# Patient Record
Sex: Male | Born: 1990
Health system: Southern US, Community
[De-identification: ages and names within clinical notes are randomized; demographics above are authoritative.]

---
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
## 2015-01-16 ENCOUNTER — Emergency Department (HOSPITAL_COMMUNITY)
Admission: EM | Admit: 2015-01-16 | Discharge: 2015-01-16 | Disposition: A | Discharge status: Home / Self Care | Payer: 59 | Attending: Emergency Medicine | Admitting: Emergency Medicine

## 2015-01-16 ENCOUNTER — Encounter (HOSPITAL_COMMUNITY): Payer: Self-pay | Admitting: Nurse Practitioner

## 2015-01-16 DIAGNOSIS — Y998 Other external cause status: Secondary | ICD-10-CM | POA: Diagnosis not present

## 2015-01-16 DIAGNOSIS — S0081XA Abrasion of other part of head, initial encounter: Secondary | ICD-10-CM | POA: Insufficient documentation

## 2015-01-16 DIAGNOSIS — T07XXXA Unspecified multiple injuries, initial encounter: Secondary | ICD-10-CM

## 2015-01-16 DIAGNOSIS — S40812A Abrasion of left upper arm, initial encounter: Secondary | ICD-10-CM | POA: Insufficient documentation

## 2015-01-16 DIAGNOSIS — S060X0A Concussion without loss of consciousness, initial encounter: Secondary | ICD-10-CM | POA: Diagnosis not present

## 2015-01-16 DIAGNOSIS — S80212A Abrasion, left knee, initial encounter: Secondary | ICD-10-CM | POA: Insufficient documentation

## 2015-01-16 DIAGNOSIS — Y9389 Activity, other specified: Secondary | ICD-10-CM | POA: Diagnosis not present

## 2015-01-16 DIAGNOSIS — Z720 Tobacco use: Secondary | ICD-10-CM | POA: Insufficient documentation

## 2015-01-16 DIAGNOSIS — Y92410 Unspecified street and highway as the place of occurrence of the external cause: Secondary | ICD-10-CM | POA: Insufficient documentation

## 2015-01-16 MED ORDER — NAPROXEN 375 MG PO TABS: 375.0000 mg | ORAL_TABLET | Freq: Two times a day (BID) | ORAL | Status: DC

## 2015-01-16 MED ORDER — CYCLOBENZAPRINE HCL 10 MG PO TABS: 10.0000 mg | ORAL_TABLET | Freq: Three times a day (TID) | ORAL | Status: DC | PRN
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
## 2016-03-12 DIAGNOSIS — R112 Nausea with vomiting, unspecified: Secondary | ICD-10-CM | POA: Diagnosis not present

## 2016-06-14 DIAGNOSIS — S60512A Abrasion of left hand, initial encounter: Secondary | ICD-10-CM | POA: Diagnosis not present

## 2016-06-14 DIAGNOSIS — X79XXXA Intentional self-harm by blunt object, initial encounter: Secondary | ICD-10-CM | POA: Insufficient documentation

## 2016-06-14 DIAGNOSIS — Y939 Activity, unspecified: Secondary | ICD-10-CM | POA: Insufficient documentation

## 2016-06-14 DIAGNOSIS — F17210 Nicotine dependence, cigarettes, uncomplicated: Secondary | ICD-10-CM | POA: Insufficient documentation

## 2016-06-14 DIAGNOSIS — Z5181 Encounter for therapeutic drug level monitoring: Secondary | ICD-10-CM | POA: Diagnosis not present

## 2016-06-14 DIAGNOSIS — Y929 Unspecified place or not applicable: Secondary | ICD-10-CM | POA: Diagnosis not present

## 2016-06-14 DIAGNOSIS — F919 Conduct disorder, unspecified: Secondary | ICD-10-CM | POA: Diagnosis not present

## 2016-06-14 DIAGNOSIS — S6992XA Unspecified injury of left wrist, hand and finger(s), initial encounter: Secondary | ICD-10-CM | POA: Diagnosis present

## 2016-06-14 DIAGNOSIS — Y999 Unspecified external cause status: Secondary | ICD-10-CM | POA: Diagnosis not present

## 2016-06-15 ENCOUNTER — Encounter (HOSPITAL_COMMUNITY): Payer: Self-pay | Admitting: Emergency Medicine

## 2016-06-15 ENCOUNTER — Emergency Department (HOSPITAL_COMMUNITY)
Admission: EM | Admit: 2016-06-15 | Discharge: 2016-06-15 | Disposition: A | Discharge status: Home / Self Care | Payer: 59 | Attending: Emergency Medicine | Admitting: Emergency Medicine

## 2016-06-15 DIAGNOSIS — F1290 Cannabis use, unspecified, uncomplicated: Secondary | ICD-10-CM | POA: Diagnosis not present

## 2016-06-15 DIAGNOSIS — F17210 Nicotine dependence, cigarettes, uncomplicated: Secondary | ICD-10-CM

## 2016-06-15 DIAGNOSIS — Z5181 Encounter for therapeutic drug level monitoring: Secondary | ICD-10-CM | POA: Diagnosis not present

## 2016-06-15 DIAGNOSIS — F69 Unspecified disorder of adult personality and behavior: Secondary | ICD-10-CM | POA: Diagnosis not present

## 2016-06-15 DIAGNOSIS — Z79899 Other long term (current) drug therapy: Secondary | ICD-10-CM | POA: Diagnosis not present

## 2016-06-15 DIAGNOSIS — R4589 Other symptoms and signs involving emotional state: Secondary | ICD-10-CM

## 2016-06-15 DIAGNOSIS — R4689 Other symptoms and signs involving appearance and behavior: Secondary | ICD-10-CM | POA: Diagnosis present

## 2016-06-15 DIAGNOSIS — F919 Conduct disorder, unspecified: Secondary | ICD-10-CM | POA: Diagnosis not present

## 2016-06-15 DIAGNOSIS — S60512A Abrasion of left hand, initial encounter: Secondary | ICD-10-CM | POA: Diagnosis not present

## 2016-06-15 LAB — COMPREHENSIVE METABOLIC PANEL
ALT: 15 U/L — ABNORMAL LOW (ref 17–63)
AST: 29 U/L (ref 15–41)
Albumin: 4.8 g/dL (ref 3.5–5.0)
Alkaline Phosphatase: 46 U/L (ref 38–126)
Anion gap: 9 (ref 5–15)
BUN: 10 mg/dL (ref 6–20)
CO2: 26 mmol/L (ref 22–32)
Calcium: 9.2 mg/dL (ref 8.9–10.3)
Chloride: 105 mmol/L (ref 101–111)
Creatinine, Ser: 0.8 mg/dL (ref 0.61–1.24)
GFR calc Af Amer: >60 mL/min (ref >60)
GFR calc non Af Amer: >60 mL/min (ref >60)
Glucose, Bld: 92 mg/dL (ref 65–99)
Potassium: 3.4 mmol/L — ABNORMAL LOW (ref 3.5–5.1)
Sodium: 140 mmol/L (ref 135–145)
Total Bilirubin: 1 mg/dL (ref 0.3–1.2)
Total Protein: 7.3 g/dL (ref 6.5–8.1)

## 2016-06-15 LAB — CBC
HCT: 40.2 % (ref 39.0–52.0)
Hemoglobin: 14.2 g/dL (ref 13.0–17.0)
MCH: 31.4 pg (ref 26.0–34.0)
MCHC: 35.3 g/dL (ref 30.0–36.0)
MCV: 88.9 fL (ref 78.0–100.0)
Platelets: 255 10*3/uL (ref 150–400)
RBC: 4.52 MIL/uL (ref 4.22–5.81)
RDW: 11.9 % (ref 11.5–15.5)
WBC: 8.6 10*3/uL (ref 4.0–10.5)

## 2016-06-15 LAB — SALICYLATE LEVEL: Salicylate Lvl: <7 mg/dL (ref 2.8–30.0)

## 2016-06-15 LAB — ACETAMINOPHEN LEVEL: Acetaminophen (Tylenol), Serum: <10 ug/mL — ABNORMAL LOW (ref 10–30)

## 2016-06-15 LAB — RAPID URINE DRUG SCREEN, HOSP PERFORMED
Amphetamines: NOT DETECTED
Barbiturates: NOT DETECTED
Benzodiazepines: NOT DETECTED
Cocaine: NOT DETECTED
Opiates: NOT DETECTED
Tetrahydrocannabinol: POSITIVE — AB

## 2016-06-15 LAB — ETHANOL: Alcohol, Ethyl (B): <5 mg/dL (ref <5)

## 2016-06-15 MED ORDER — LORAZEPAM 1 MG PO TABS: 1.0000 mg | ORAL_TABLET | Freq: Three times a day (TID) | ORAL | Status: DC | PRN

## 2016-06-15 MED ORDER — ALUM & MAG HYDROXIDE-SIMETH 200-200-20 MG/5ML PO SUSP: 30.0000 mL | ORAL | Status: DC | PRN

## 2016-06-15 MED ORDER — ACETAMINOPHEN 325 MG PO TABS: 650.0000 mg | ORAL_TABLET | ORAL | Status: DC | PRN

## 2016-06-15 MED ORDER — ONDANSETRON HCL 4 MG PO TABS: 4.0000 mg | ORAL_TABLET | Freq: Three times a day (TID) | ORAL | Status: DC | PRN

## 2016-06-15 MED ORDER — NICOTINE 21 MG/24HR TD PT24: 21.0000 mg | MEDICATED_PATCH | Freq: Every day | TRANSDERMAL | Status: DC

## 2016-06-15 MED ORDER — IBUPROFEN 200 MG PO TABS: 600.0000 mg | ORAL_TABLET | Freq: Three times a day (TID) | ORAL | Status: DC | PRN

## 2016-06-15 MED ORDER — ZOLPIDEM TARTRATE 5 MG PO TABS: 5.0000 mg | ORAL_TABLET | Freq: Every evening | ORAL | Status: DC | PRN

## 2016-06-15 NOTE — ED Notes (Signed)
tts  In process
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
## 2016-06-15 NOTE — Discharge Instructions (Signed)
[facility] ([facility]) [address], [place], [place] [address] [phone] Call [facility]. Program includes intensive therapy in group setting and psychiatry services. (If, following your appointment, this program is not the best fit for you, you will be given information for individual therapy or other appropriate options a [facility])

## 2016-06-15 NOTE — ED Triage Notes (Signed)
Patient presents to ED with parents for assessment of anger episodes and SI.  Patient states "things piss me off all the time".  Patient agreed to come to ED if his parents would allow him to use the phone to call his girlfriend.  Patient broke his personal phone after calling his girlfriend over 10 times in a row.  He has also punched walls today and bashed his head on the wall "because it hurt".  Patient very apathetic to "are you having thoughts of hurting yourself" by stating "well, yeah, duh, all the time".  Patient denies plan.

## 2016-06-15 NOTE — ED Notes (Signed)
Pt and family was updated on disop decision; pt was apprehensive to staying; PA was advised; PA now at bedside speaking with family and pt

## 2016-06-15 NOTE — ED Notes (Signed)
Parents requested to be called before Psy eval this morning to be present at assessment;

## 2016-06-15 NOTE — Progress Notes (Signed)
Discussed pt's case with psychiatry team. Telepsych assessment this morning determined pt does not need inpatient treatment, however due to presence of severe depressive/anxious symptoms and recent relational stressors, it is recommended pt engage in outpatient psychiatric services. NP spoke with pt and pt's mother, recommending PHP due to pt's current symptoms. Mother and pt reportedly receptive. Understand traditional OP therapy/psychiatry is also an option.   Provided  information in pt's d/c summary for [facility] partial hospitalization program.   Ilean SkillMeghan Terrye Dombrosky, MSW, LCSW Clinical Social Work, Disposition  06/15/2016 [phone]

## 2016-06-15 NOTE — Progress Notes (Signed)
Per Donell SievertSpencer, Simon PA recommend a.m. Psych evaluation Kerstin Crusoe K. Sherlon HandingHarris, LCAS-A, LPC-A, [facility]  Counselor 06/15/2016 4:41 AM
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
## 2016-06-15 NOTE — ED Notes (Signed)
TTS being done at bedside 

## 2016-06-15 NOTE — BH Assessment (Signed)
Contacted pt. RN to order cart for TTS Marvene Strohm K. Sherlon HandingHarris, LCAS-A, LPC-A, [facility]  Counselor 06/15/2016 4:05 AM
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
## 2016-06-15 NOTE — ED Notes (Signed)
Patient was given a snack and drink, and a Regular Diet was ordered for Lunch. 

## 2016-06-23 ENCOUNTER — Ambulatory Visit (INDEPENDENT_AMBULATORY_CARE_PROVIDER_SITE_OTHER): Payer: 59 | Admitting: Licensed Clinical Social Worker

## 2016-06-23 DIAGNOSIS — F411 Generalized anxiety disorder: Secondary | ICD-10-CM | POA: Diagnosis not present
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
## 2016-07-06 ENCOUNTER — Ambulatory Visit (INDEPENDENT_AMBULATORY_CARE_PROVIDER_SITE_OTHER): Payer: 59 | Admitting: Licensed Clinical Social Worker

## 2016-07-06 DIAGNOSIS — F411 Generalized anxiety disorder: Secondary | ICD-10-CM | POA: Diagnosis not present

## 2016-07-07 ENCOUNTER — Encounter (HOSPITAL_COMMUNITY): Payer: Self-pay | Admitting: Licensed Clinical Social Worker
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
## 2016-07-13 ENCOUNTER — Ambulatory Visit (INDEPENDENT_AMBULATORY_CARE_PROVIDER_SITE_OTHER): Payer: 59 | Admitting: Licensed Clinical Social Worker

## 2016-07-13 ENCOUNTER — Encounter (HOSPITAL_COMMUNITY): Payer: Self-pay | Admitting: Licensed Clinical Social Worker

## 2016-07-13 DIAGNOSIS — F411 Generalized anxiety disorder: Secondary | ICD-10-CM

## 2016-07-13 NOTE — Progress Notes (Signed)
   THERAPIST PROGRESS NOTE  Session Time: 11:10-12pm  Participation Level: Active  Behavioral Response: DisheveledAlert/Quiet  Type of Therapy: Individual Therapy  Treatment Goals addressed: Coping  Interventions: CBT  Summary: Derek Lynn is a 26 y.o. male who presents with his homework assignments. Pt completed a paper on amotivational symdrome. Pt exhibits all the symptoms due to his marijuana use. Pt reports after completing the paper he is rethinking his marijuana use. Pt is also completing ADETS course for his pending DWI. Pt is concerned about the communication skills being used in his relationship. Went over I statements and fair fighting rules. Role played using I statements. Pt was active during the intervention.  Suicidal/Homicidal: Nowithout intent/plan  Therapist Response: Evaluated pt for progress. Assisted pt processing for stressors, communication skills, relationship problem solving, marijuana use.  Plan: Return again in 3 weeks. To monitor marijuana use. Suggested to pt to have his mother join for a session. He was in agreement and will discuss at next session.  Diagnosis: Axis I: GAD        Hailly Fess S, LCAS 07/13/2016

## 2016-07-28 ENCOUNTER — Ambulatory Visit (HOSPITAL_COMMUNITY): Payer: Self-pay | Admitting: Licensed Clinical Social Worker

## 2016-08-15 ENCOUNTER — Ambulatory Visit (INDEPENDENT_AMBULATORY_CARE_PROVIDER_SITE_OTHER): Payer: 59 | Admitting: Licensed Clinical Social Worker

## 2016-08-15 DIAGNOSIS — F411 Generalized anxiety disorder: Secondary | ICD-10-CM | POA: Diagnosis not present

## 2016-08-15 NOTE — Progress Notes (Signed)
   THERAPIST PROGRESS NOTE  Session Time: 11:10-12pm  Participation Level: Active  Behavioral Response: DisheveledAlert/Quiet  Type of Therapy: Individual Therapy  Treatment Goals addressed: Coping  Interventions: CBT  Summary: Derek Lynn is a 26 y.o. male. He brought his girlfriend in today. He wanted to talk about their relationship and communication skills. His girlfriend was quite nice and was able to discuss intelligently their relationship and communication style. She talked about the incident that brought pt to treatment. Pt also talked about how his frustration, feelings of anger became overwhelming thus causing a blowup. She has never seen him act that way and the pt is normally quiet and reserved. Discussed fair fighting rules and role played communication styles. Role played using I statements in communciation. They were both engaged and interested in both discussion and role playing. Discussed [name] continued use of marijuana. He admits to using almost everyday but the amount he uses varies. He has no desire to stop using it but is considering cutting down. Will continue to monitor this. Pt has also asked his mother to come with him to therapy.       Suicidal/Homicidal: Nowithout intent/plan  Therapist Response: Assessed [name] current functioning and reviewed progress.  Assisted pt processing , communication skills, relationship problem solving, marijuana use. Assisted pt processing for the management of his stressors.  Plan: Return again in 3 weeks. To monitor marijuana use. Suggested to pt to have his mother join for a session. He was in agreement .  Diagnosis: Axis I: GAD        Derek Lynn, LCAS 08/15/2016

## 2016-08-18 ENCOUNTER — Ambulatory Visit (HOSPITAL_COMMUNITY): Payer: Self-pay | Admitting: Psychiatry

## 2016-08-18 ENCOUNTER — Encounter (HOSPITAL_COMMUNITY): Payer: Self-pay | Admitting: Licensed Clinical Social Worker

## 2016-09-05 ENCOUNTER — Ambulatory Visit (HOSPITAL_COMMUNITY): Payer: Self-pay | Admitting: Licensed Clinical Social Worker

## 2017-05-16 ENCOUNTER — Encounter (HOSPITAL_COMMUNITY): Payer: Self-pay | Admitting: Licensed Clinical Social Worker

## 2017-05-16 ENCOUNTER — Ambulatory Visit (INDEPENDENT_AMBULATORY_CARE_PROVIDER_SITE_OTHER): Payer: 59 | Admitting: Licensed Clinical Social Worker

## 2017-05-16 DIAGNOSIS — F411 Generalized anxiety disorder: Secondary | ICD-10-CM
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
## 2017-06-08 ENCOUNTER — Encounter (HOSPITAL_COMMUNITY): Payer: Self-pay | Admitting: Licensed Clinical Social Worker

## 2017-06-08 ENCOUNTER — Ambulatory Visit (INDEPENDENT_AMBULATORY_CARE_PROVIDER_SITE_OTHER): Payer: 59 | Admitting: Licensed Clinical Social Worker

## 2017-06-08 DIAGNOSIS — F411 Generalized anxiety disorder: Secondary | ICD-10-CM

## 2017-06-08 NOTE — Progress Notes (Signed)
   THERAPIST PROGRESS NOTE  Session Time: 9:10-10am  Participation Level: Active  Behavioral Response: DisheveledAlert/Quiet  Type of Therapy: Individual Therapy  Treatment Goals addressed: Coping  Interventions: CBT  Summary: Derek Lynn is a 27 y.o. male who presents for therapy with his mother. Pt discussed his psychiatric symptoms and current life events.Pt was very distraught. His girlfriend has moved out and doesn't want to be in a relationship with pt anymore. Pt's mother reports pt hasn't slept in several nights, his anxiety has increased.Pt was unhealthily focused on the loss of the relationship. Any suggestion made during session pt was tearful and not willing to listen. Gave pt suggestions on working on himself so that he can become healthier and have healthier relationships in the future. Pt was unreasonable in his willingness to listen to any suggestions from therapist or his mother. Asked pt about potential self harm and he replied no. Gave mother info about what to do if that changes. Made appt with a psychiatrist, Dr. Vanetta ShawlHisada, for 3/16.    Suicidal/Homicidal: Nowithout intent/plan  Therapist Response: Assessed pt's current functioning and reviewed progress.  Assisted pt processing , current lfie events and psychiatric symptoms, psychiatrist, anxiety symptoms. Assisted pt processing for the management of his stressors.  Plan: Return again in 2 weeks   Diagnosis: Axis I: GAD        Kaitelyn Jamison S, LCAS 06/08/2017

## 2017-06-12 ENCOUNTER — Ambulatory Visit (HOSPITAL_COMMUNITY): Payer: Self-pay | Admitting: Licensed Clinical Social Worker

## 2017-06-14 ENCOUNTER — Ambulatory Visit (HOSPITAL_COMMUNITY): Payer: Self-pay | Admitting: Licensed Clinical Social Worker
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
## 2017-06-17 ENCOUNTER — Ambulatory Visit (INDEPENDENT_AMBULATORY_CARE_PROVIDER_SITE_OTHER): Payer: 59 | Admitting: Psychiatry

## 2017-06-17 ENCOUNTER — Encounter (HOSPITAL_COMMUNITY): Payer: Self-pay | Admitting: Psychiatry

## 2017-06-17 VITALS — BP 112/68 | HR 54 | Ht 70.0 in | Wt 169.0 lb

## 2017-06-17 DIAGNOSIS — F410 Panic disorder [episodic paroxysmal anxiety] without agoraphobia: Secondary | ICD-10-CM

## 2017-06-17 DIAGNOSIS — Z79899 Other long term (current) drug therapy: Secondary | ICD-10-CM

## 2017-06-17 DIAGNOSIS — R450 Nervousness: Secondary | ICD-10-CM

## 2017-06-17 DIAGNOSIS — Z630 Problems in relationship with spouse or partner: Secondary | ICD-10-CM | POA: Diagnosis not present

## 2017-06-17 DIAGNOSIS — G4700 Insomnia, unspecified: Secondary | ICD-10-CM | POA: Diagnosis not present

## 2017-06-17 DIAGNOSIS — F419 Anxiety disorder, unspecified: Secondary | ICD-10-CM

## 2017-06-17 DIAGNOSIS — Z87891 Personal history of nicotine dependence: Secondary | ICD-10-CM

## 2017-06-17 DIAGNOSIS — F331 Major depressive disorder, recurrent, moderate: Secondary | ICD-10-CM | POA: Diagnosis not present

## 2017-06-17 MED ORDER — SERTRALINE HCL 50 MG PO TABS: ORAL_TABLET | ORAL | 1 refills | Status: DC

## 2017-06-17 NOTE — Patient Instructions (Signed)
1. Start sertraline 25 mg at night for one week, then 50 mg at night 2. Continue to see a therapist 3. Obtain TSH 4. Return to clinic in one month

## 2017-06-18 LAB — TSH: TSH: 1.92 u[IU]/mL (ref 0.450–4.500)

## 2017-06-19 ENCOUNTER — Encounter (HOSPITAL_COMMUNITY): Payer: Self-pay | Admitting: Psychiatry

## 2017-06-30 MED FILL — SERTRALINE HCL 50 MG TABLET: 50 | 30 days supply | Qty: 30 | Fill #0

## 2017-07-05 ENCOUNTER — Encounter (HOSPITAL_COMMUNITY): Payer: Self-pay | Admitting: Licensed Clinical Social Worker

## 2017-07-05 ENCOUNTER — Ambulatory Visit (INDEPENDENT_AMBULATORY_CARE_PROVIDER_SITE_OTHER): Payer: 59 | Admitting: Licensed Clinical Social Worker

## 2017-07-05 DIAGNOSIS — F331 Major depressive disorder, recurrent, moderate: Secondary | ICD-10-CM

## 2017-07-05 NOTE — Progress Notes (Signed)
   THERAPIST PROGRESS NOTE  Session Time: 2:10-3pm  Participation Level: Active  Behavioral Response:Alert/Quiet  Type of Therapy: Individual Therapy  Treatment Goals addressed: Coping  Interventions: CBT  Summary: Derek Lynn is a 26 y.o. male who presents for therapy. Pt discussed his psychiatric symptoms and current life events. Pt [facility] with Dr. Hisadda [facility]. She prescribed Zoloft which pt just recently picked up from the pharmacy and has not taken any medication. Pt reports he researched the medication and does not think he needs medications and is concerned about the effect on his brain. I discussed the benefit of the medication with pt and asked open ended questions. The pt continues to ruminate about his girlfriend. Ask pt open ended questions. Pt is planning his future around a girl that has left him. Worked with pt on personal future plans. This was a difficult task for pt as he wants to involve all planning around his x girlfriend.   Suicidal/Homicidal: Nowithout intent/plan  Therapist Response: Assessed pt's current functioning and reviewed progress.  Assisted pt processing , current lfie events and psychiatric symptoms, psychiatrist, medication, future plans. Assisted pt processing for the management of his stressors.  Plan: Return again in 2 weeks   Diagnosis: Axis I: GAD        MACKENZIE,LISBETH S, LCAS 07/05/2017 

## 2017-07-12 ENCOUNTER — Ambulatory Visit (INDEPENDENT_AMBULATORY_CARE_PROVIDER_SITE_OTHER): Payer: 59 | Admitting: Licensed Clinical Social Worker

## 2017-07-12 ENCOUNTER — Encounter (HOSPITAL_COMMUNITY): Payer: Self-pay | Admitting: Licensed Clinical Social Worker

## 2017-07-12 DIAGNOSIS — F331 Major depressive disorder, recurrent, moderate: Secondary | ICD-10-CM

## 2017-07-12 NOTE — Progress Notes (Signed)
   THERAPIST PROGRESS NOTE  Session Time: 2:10-3pm  Participation Level: Active  Behavioral Response:Alert/Quiet/Euthymic  Type of Therapy: Individual Therapy  Treatment Goals addressed: Coping  Interventions: CBT  Summary: Derek Lynn is a 27 y.o. male who presents for therapy. Pt discussed his psychiatric symptoms and current life events. Pt reports he is less obsessive about his x girlfriend and their relationship, however, he still talks about a future with her. He still has not taken his prescribed medication. Discussed with pt what it would look like if he focused on himself. Asked open ended questions. Discussed with pt and showed him how to compartmentalize so that he will not become so overwhelmed with everything he is trying to accomplish. Pt is current focused on school, getting in, taking as many classes as possible so that he will graduate as soon as possible. Worked with pt on how to compartmentalize school with suggestions on first step.     Suicidal/Homicidal: Nowithout intent/plan  Therapist Response: Assessed pt's current functioning and reviewed progress.  Assisted pt processing , current lfie events and psychiatric symptoms, medication, future plans, compartmentalizing, school. Assisted pt processing for the management of his stressors.  Plan: Return again in 2 weeks   Diagnosis: Axis I: GAD        MACKENZIE,LISBETH S, LCAS 07/12/2017

## 2017-07-19 ENCOUNTER — Encounter (HOSPITAL_COMMUNITY): Payer: Self-pay | Admitting: Licensed Clinical Social Worker

## 2017-07-19 ENCOUNTER — Ambulatory Visit (INDEPENDENT_AMBULATORY_CARE_PROVIDER_SITE_OTHER): Payer: 59 | Admitting: Licensed Clinical Social Worker

## 2017-07-19 DIAGNOSIS — F331 Major depressive disorder, recurrent, moderate: Secondary | ICD-10-CM

## 2017-07-19 DIAGNOSIS — F411 Generalized anxiety disorder: Secondary | ICD-10-CM
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
## 2017-07-26 ENCOUNTER — Ambulatory Visit (HOSPITAL_COMMUNITY): Payer: Self-pay | Admitting: Licensed Clinical Social Worker

## 2017-07-27 NOTE — Progress Notes (Deleted)
BH MD/PA/NP OP Progress Note  07/27/2017 12:24 PM Derek Lynn  MRN:  [id number]  Chief Complaint:  HPI:   May have not started sertraline  Security, skateboarding THC positive,   Visit Diagnosis: No diagnosis found.  Past Psychiatric History:  I have reviewed the patient's psychiatry history in detail and updated the patient record. Outpatient: denies (diagnosed with ADD) Psychiatry admission: denies Previous suicide attempt: denies, denies SIB Past trials of medication: Adderall (discontinued in high school as he felt numb)  History of violence: denies    Past Medical History: No past medical history on file. No past surgical history on file.  Family Psychiatric History:  I have reviewed the patient's family history in detail and updated the patient record.   Family History: No family history on file.  Social History:  Social History   Socioeconomic History  . Marital status: Single    Spouse name: Not on file  . Number of children: 0  . Years of education: Not on file  . Highest education level: Some college, no degree  Occupational History  . Not on file  Social Needs  . Financial resource strain: Not hard at all  . Food insecurity:    Worry: Never true    Inability: Never true  . Transportation needs:    Medical: No    Non-medical: No  Tobacco Use  . Smoking status: Former Smoker    Types: Cigarettes  . Smokeless tobacco: Never Used  Substance and Sexual Activity  . Alcohol use: No    Frequency: Never  . Drug use: No    Types: Marijuana    Comment: last use over a month ago  . Sexual activity: Never  Lifestyle  . Physical activity:    Days per week: 7 days    Minutes per session: 50 min  . Stress: To some extent  Relationships  . Social connections:    Talks on phone: More than three times a week    Gets together: Never    Attends religious service: Never    Active member of club or organization: No    Attends meetings of clubs or  organizations: Never    Relationship status: Never married  Other Topics Concern  . Not on file  Social History Narrative  . Not on file    Allergies: No Known Allergies  Metabolic Disorder Labs: No results found for: HGBA1C, MPG No results found for: PROLACTIN No results found for: CHOL, TRIG, HDL, CHOLHDL, VLDL, LDLCALC Lab Results  Component Value Date   TSH 1.920 06/17/2017    Therapeutic Level Labs: No results found for: LITHIUM No results found for: VALPROATE No components found for:  CBMZ  Current Medications: Current Outpatient Medications  Medication Sig Dispense Refill  . sertraline (ZOLOFT) 50 MG tablet 25 mg at night for one week, then 50 mg at night 30 tablet 1   No current facility-administered medications for this visit.      Musculoskeletal: Strength & Muscle Tone: within normal limits Gait & Station: normal Patient leans: N/A  Psychiatric Specialty Exam: ROS  There were no vitals taken for this visit.There is no height or weight on file to calculate BMI.  General Appearance: Fairly Groomed  Eye Contact:  Good  Speech:  Clear and Coherent  Volume:  Normal  Mood:  {BHH MOOD:22306}  Affect:  {Affect (PAA):22687}  Thought Process:  Coherent  Orientation:  Full (Time, Place, and Person)  Thought Content: Logical   Suicidal Thoughts:  {  ST/HT (PAA):22692}  Homicidal Thoughts:  {ST/HT (PAA):22692}  Memory:  Immediate;   Good  Judgement:  {Judgement (PAA):22694}  Insight:  {Insight (PAA):22695}  Psychomotor Activity:  Normal  Concentration:  Concentration: Good and Attention Span: Good  Recall:  Good  Fund of Knowledge: Good  Language: Good  Akathisia:  No  Handed:  Right  AIMS (if indicated): not done  Assets:  Communication Skills Desire for Improvement  ADL's:  Intact  Cognition: WNL  Sleep:  {BHH GOOD/FAIR/POOR:22877}   Screenings:   Assessment and Plan:  Derek Lynn is a 27 y.o. year old male with a history of depression, anxiety,  marijuana use , who presents for follow up appointment for No diagnosis found.  # MDD, moderate, recurrent without psychotic features # Unspecified anxiety disorder # r/o marijuana induced mood disorder  Exam is notable for his flat affect and monotonous speech.  Patient endorses worsening neurovegetative symptoms with significant anxiety in the recent breakup with his girlfriend. Patient reports previous similar episode, which was also in the context of break up.  After having discussion in length, he agrees to start antidepressant. Will start sertraline to target neurovegetative and anxiety. Discussed potential GI side effect, drowsiness and increased SI in younger population. He is advised to contact the office if any side effect.  He will continue to see Ms. Mackenzie for therapy. No safety concern.   Plan 1. Start sertraline 25 mg at night for one week, then 50 mg at night 2. Continue to see a therapist  3. Reviewed TSH; wnl 4. Return to clinic in one month   The patient demonstrates the following risk factors for suicide: Chronic risk factors for suicide include: psychiatric disorder of depression, anxeity. Acute risk factors for suicide include: loss (financial, interpersonal, professional). Protective factors for this patient include: positive social support, responsibility to others (children, family), coping skills and hope for the future. Considering these factors, the overall suicide risk at this point appears to be low. Patient is appropriate for outpatient follow up.    Neysa Hotter, MD 07/27/2017, 12:24 PM

## 2017-07-29 ENCOUNTER — Ambulatory Visit (HOSPITAL_COMMUNITY): Payer: 59 | Admitting: Psychiatry

## 2017-08-22 ENCOUNTER — Ambulatory Visit (HOSPITAL_COMMUNITY): Payer: Self-pay | Admitting: Licensed Clinical Social Worker

## 2017-08-24 ENCOUNTER — Encounter (HOSPITAL_COMMUNITY): Payer: Self-pay | Admitting: Licensed Clinical Social Worker

## 2017-08-24 ENCOUNTER — Ambulatory Visit (INDEPENDENT_AMBULATORY_CARE_PROVIDER_SITE_OTHER): Payer: 59 | Admitting: Licensed Clinical Social Worker

## 2017-08-24 DIAGNOSIS — F331 Major depressive disorder, recurrent, moderate: Secondary | ICD-10-CM | POA: Diagnosis not present
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
## 2017-08-30 ENCOUNTER — Encounter (HOSPITAL_COMMUNITY): Payer: Self-pay | Admitting: Licensed Clinical Social Worker

## 2017-08-30 ENCOUNTER — Ambulatory Visit (INDEPENDENT_AMBULATORY_CARE_PROVIDER_SITE_OTHER): Payer: 59 | Admitting: Licensed Clinical Social Worker

## 2017-08-30 DIAGNOSIS — F331 Major depressive disorder, recurrent, moderate: Secondary | ICD-10-CM | POA: Diagnosis not present

## 2017-08-30 NOTE — Progress Notes (Signed)
   THERAPIST PROGRESS NOTE  Session Time: 2:10-3pm  Participation Level: Active  Behavioral Response:Alert/Quiet/Euthymic  Type of Therapy: Individual Therapy  Treatment Goals addressed: Coping  Interventions: CBT  Summary: Derek Lynn is a 27 y.o. male who presents for therapy. Pt discussed his psychiatric symptoms and current life events. Pt continues to report reports he is less obsessive about his x girlfriend and their relationship. Asked open ended questions.He still has not taken his prescribed medication.  Asked open ended questions about his continued refusal to take an antidepressant and the benefits of one. Pt continues to decide to work or go to school. Assisted pt making a list of benefits of both. Pt was able to articulate a plan to go to school, which begins tomorrow and work a part time job. Then pt proceeded to make a list of the pros and cons of each job he had applied for.    Suicidal/Homicidal: Nowithout intent/plan  Therapist Response: Assessed pt's current functioning and reviewed progress.  Assisted pt processing x girlfriend, benefits of medication, school vs work. Assisted pt processing for the management of his stressors.  Plan: Return again in 2 weeks   Diagnosis: Axis I: MDD        Vernona Rieger, LCAS 08/30/2017

## 2017-09-06 ENCOUNTER — Ambulatory Visit (HOSPITAL_COMMUNITY): Payer: Self-pay | Admitting: Licensed Clinical Social Worker

## 2017-09-13 ENCOUNTER — Encounter (HOSPITAL_COMMUNITY): Payer: Self-pay | Admitting: Licensed Clinical Social Worker

## 2017-09-13 ENCOUNTER — Encounter

## 2017-09-13 ENCOUNTER — Ambulatory Visit (INDEPENDENT_AMBULATORY_CARE_PROVIDER_SITE_OTHER): Payer: 59 | Admitting: Licensed Clinical Social Worker

## 2017-09-13 DIAGNOSIS — F331 Major depressive disorder, recurrent, moderate: Secondary | ICD-10-CM

## 2017-09-13 NOTE — Progress Notes (Signed)
   THERAPIST PROGRESS NOTE  Session Time: 2:10-3pm  Participation Level: Active  Behavioral Response:Alert/Quiet/Euthymic  Type of Therapy: Individual Therapy  Treatment Goals addressed: Coping  Interventions: CBT  Summary: Derek Lynn is a 27 y.o. male who presents for therapy. Pt discussed his psychiatric symptoms and current life events. Pt reports he has been more anxious and have more depressive symptoms. Re-discussed the benefits of medication. Discussed how psychiatric medications work on the brain's receptor sites. Also talked about the DNA test that will assist with what meds would work well with his DNA. Pt is still resistant to meds. He continues to talk on the phone with his x girlfriend but is much less obsessive. Asked open ended questions about continuing to talk on the phone with X. Discussed with pt the benefit of not entering into another relationship but continue working on self. Pt is lonely and is alone quite a bit. Gave pt suggestions on how to meet new people. Pt continues his part time job and is in school at [employer].    Suicidal/Homicidal: Nowithout intent/plan  Therapist Response: Assessed pt's current functioning and reviewed progress.  Assisted pt processing x girlfriend, benefits of medication, ways to meet new people, self care.  Assisted pt processing for the management of his stressors.  Plan: Return again in 2 weeks   Diagnosis: Axis I: MDD        Vernona RiegerMACKENZIE,LISBETH S, LCAS 09/13/2017

## 2017-09-20 ENCOUNTER — Ambulatory Visit (INDEPENDENT_AMBULATORY_CARE_PROVIDER_SITE_OTHER): Payer: 59 | Admitting: Licensed Clinical Social Worker

## 2017-09-20 ENCOUNTER — Encounter (HOSPITAL_COMMUNITY): Payer: Self-pay | Admitting: Licensed Clinical Social Worker

## 2017-09-20 DIAGNOSIS — F331 Major depressive disorder, recurrent, moderate: Secondary | ICD-10-CM | POA: Diagnosis not present

## 2017-09-20 NOTE — Progress Notes (Signed)
   THERAPIST PROGRESS NOTE  Session Time: 3:10-4pm  Participation Level: Active  Behavioral Response:Alert/Quiet/Sad/Depressed  Type of Therapy: Individual Therapy  Treatment Goals addressed: Coping  Interventions: CBT  Summary: Derek Lynn is a 27 y.o. male who presents for therapy. Pt discussed his psychiatric symptoms and current life events. Pt presented looking very sad and depressed. He ruminates on the same thing: x girlfriend who is coming to [place] this week to get all her things and go back to [place]. She has asked to see him while she is here. Discussed this at length with ZO:XWRUEpt:risks and benefits of seeing her. Pt reports he still is in school and is doing well. He has a new job at the [employer] [place] but was pessimistic about that. There was nothing positive discussed during session.    Suicidal/Homicidal: Nowithout intent/plan  Therapist Response: Assessed pt's current functioning and reviewed progress.  Assisted pt processing x girlfriend, risks and benefits of see x girlfriend. self care.  Assisted pt processing for the management of his stressors.  Plan: Return again in 2 weeks   Diagnosis: Axis I: MDD        Derek Lynn,LISBETH S, LCAS 09/20/2017

## 2017-09-27 ENCOUNTER — Ambulatory Visit (HOSPITAL_COMMUNITY): Payer: Self-pay | Admitting: Licensed Clinical Social Worker

## 2017-09-28 ENCOUNTER — Ambulatory Visit (HOSPITAL_COMMUNITY): Payer: Self-pay | Admitting: Licensed Clinical Social Worker

## 2017-10-11 ENCOUNTER — Encounter (HOSPITAL_COMMUNITY): Payer: Self-pay | Admitting: Licensed Clinical Social Worker

## 2017-10-11 ENCOUNTER — Ambulatory Visit (INDEPENDENT_AMBULATORY_CARE_PROVIDER_SITE_OTHER): Payer: 59 | Admitting: Licensed Clinical Social Worker

## 2017-10-11 DIAGNOSIS — F331 Major depressive disorder, recurrent, moderate: Secondary | ICD-10-CM | POA: Diagnosis not present

## 2017-10-11 NOTE — Progress Notes (Signed)
   THERAPIST PROGRESS NOTE  Session Time: 3:10-4pm  Participation Level: Active  Behavioral Response:Alert/Quiet/Sad/Depressed  Type of Therapy: Individual Therapy  Treatment Goals addressed: Coping  Interventions: CBT  Summary: Derek Lynn is a 27 y.o. male who presents for therapy. Pt discussed his psychiatric symptoms and current life events. Pt presents sad and depressed, with his head hanging down. He ruminated about his girlfriend coming to town, packing up all her belongings and [place]. Asked open ended questions and validated his feelings. Discussed external factors that affect his mood. Pt started a job with [employer] in [place]. He was affect was flat in discussing his new job. Discussed with pt how the new job fits in his goal planning. Discussed with pt gratitudes and how many positive things he has in his life. He was able to name numerous gratitudes and said he felt a little better. Discussed with pt coping tools to deal with his feelings of depression. He still maintains he will not take medication.   Suicidal/Homicidal: Nowithout intent/plan  Therapist Response: Assessed pt's current functioning and reviewed progress.  Assisted pt processing x girlfriend, new job, goal planning, gratitudes, medication.  Assisted pt processing for the management of his stressors.  Plan: Return again in 2 weeks   Diagnosis: Axis I: MDD        [name] S, LCAS 10/11/2017

## 2017-10-26 ENCOUNTER — Ambulatory Visit (HOSPITAL_COMMUNITY): Payer: Self-pay | Admitting: Licensed Clinical Social Worker

## 2017-11-07 ENCOUNTER — Ambulatory Visit (HOSPITAL_COMMUNITY): Payer: Self-pay | Admitting: Licensed Clinical Social Worker

## 2017-11-14 ENCOUNTER — Ambulatory Visit (HOSPITAL_COMMUNITY): Payer: Self-pay | Admitting: Licensed Clinical Social Worker

## 2017-11-21 ENCOUNTER — Ambulatory Visit (INDEPENDENT_AMBULATORY_CARE_PROVIDER_SITE_OTHER): Payer: 59 | Admitting: Licensed Clinical Social Worker

## 2017-11-21 ENCOUNTER — Encounter (HOSPITAL_COMMUNITY): Payer: Self-pay | Admitting: Licensed Clinical Social Worker

## 2017-11-21 ENCOUNTER — Ambulatory Visit (HOSPITAL_COMMUNITY): Payer: Self-pay | Admitting: Licensed Clinical Social Worker

## 2017-11-21 DIAGNOSIS — F331 Major depressive disorder, recurrent, moderate: Secondary | ICD-10-CM

## 2017-11-21 NOTE — Progress Notes (Signed)
   THERAPIST PROGRESS NOTE  Session Time: 3:10-4pm  Participation Level: Active  Behavioral Response:Alert/Quiet/Sad/Depressed  Type of Therapy: Individual Therapy  Treatment Goals addressed: Coping  Interventions: CBT  Summary: Derek Lynn is a 27 y.o. male who presents for therapy. Pt discussed his psychiatric symptoms and current life events. Pt presents sad and depressed, with his head hanging down. Pt was being discharged for failing to come in for appointments. The letter had not been sent out yet. I talked with the pt about his continuing therapy. He is depressed and has anxiety but he is not willing to do anything about it. I suggested PHP to him. He asked questions about the program. He is still unwilling to take medication. I am unsure how to continue to work with the pt who does not try to do anything different. He has quit both jobs he had because he didn't like them. He is in school still but has to take his course over because he didn't take the final exam. I asked open ended questions about continuation of therapy. I suggested he think about PHP and his desire to continue in therapy and to let me know.  Suicidal/Homicidal: Nowithout intent/plan  Therapist Response: Assessed [name] current functioning and reviewed progress.  Assisted pt processing continuation of therapy, PHP, what he'Lynn willing to do different, medication.  Assisted pt processing for the management of his stressors.  Plan: Return again in 2 weeks   Diagnosis: Axis I: MDD        Derek Lynn, LCAS 11/21/2017

## 2017-11-28 ENCOUNTER — Ambulatory Visit (HOSPITAL_COMMUNITY): Payer: Self-pay | Admitting: Licensed Clinical Social Worker

## 2017-12-07 ENCOUNTER — Ambulatory Visit (HOSPITAL_COMMUNITY): Payer: Self-pay | Admitting: Licensed Clinical Social Worker

## 2017-12-14 ENCOUNTER — Ambulatory Visit (HOSPITAL_COMMUNITY): Payer: Self-pay | Admitting: Licensed Clinical Social Worker

## 2017-12-20 ENCOUNTER — Ambulatory Visit (HOSPITAL_COMMUNITY): Payer: Self-pay | Admitting: Licensed Clinical Social Worker

## 2017-12-21 ENCOUNTER — Ambulatory Visit (HOSPITAL_COMMUNITY): Payer: Self-pay | Admitting: Licensed Clinical Social Worker

## 2017-12-27 ENCOUNTER — Encounter (HOSPITAL_COMMUNITY): Payer: Self-pay | Admitting: Licensed Clinical Social Worker

## 2017-12-27 ENCOUNTER — Ambulatory Visit (HOSPITAL_COMMUNITY): Payer: Self-pay | Admitting: Licensed Clinical Social Worker

## 2017-12-28 ENCOUNTER — Ambulatory Visit (HOSPITAL_COMMUNITY): Payer: Self-pay | Admitting: Licensed Clinical Social Worker

## 2018-02-15 ENCOUNTER — Encounter (HOSPITAL_COMMUNITY): Payer: Self-pay | Admitting: Emergency Medicine

## 2018-02-15 ENCOUNTER — Emergency Department (HOSPITAL_COMMUNITY)
Admission: EM | Admit: 2018-02-15 | Discharge: 2018-02-15 | Disposition: A | Discharge status: Home / Self Care | Payer: 59 | Attending: Emergency Medicine | Admitting: Emergency Medicine

## 2018-02-15 DIAGNOSIS — R112 Nausea with vomiting, unspecified: Secondary | ICD-10-CM | POA: Diagnosis not present

## 2018-02-15 DIAGNOSIS — R197 Diarrhea, unspecified: Secondary | ICD-10-CM | POA: Insufficient documentation

## 2018-02-15 DIAGNOSIS — R1013 Epigastric pain: Secondary | ICD-10-CM | POA: Diagnosis not present

## 2018-02-15 DIAGNOSIS — Z87891 Personal history of nicotine dependence: Secondary | ICD-10-CM | POA: Diagnosis not present

## 2018-02-15 LAB — URINALYSIS, ROUTINE W REFLEX MICROSCOPIC
Bilirubin Urine: NEGATIVE
Glucose, UA: NEGATIVE mg/dL
HGB URINE DIPSTICK: NEGATIVE
KETONES UR: NEGATIVE mg/dL
Leukocytes, UA: NEGATIVE
Nitrite: NEGATIVE
Protein, ur: NEGATIVE mg/dL
SPECIFIC GRAVITY, URINE: 1.027 (ref 1.005–1.030)
pH: 7 (ref 5.0–8.0)

## 2018-02-15 LAB — CBC
HEMATOCRIT: 46.4 % (ref 39.0–52.0)
Hemoglobin: 15.5 g/dL (ref 13.0–17.0)
MCH: 31.4 pg (ref 26.0–34.0)
MCHC: 33.4 g/dL (ref 30.0–36.0)
MCV: 94.1 fL (ref 80.0–100.0)
Platelets: 272 10*3/uL (ref 150–400)
RBC: 4.93 MIL/uL (ref 4.22–5.81)
RDW: 12.4 % (ref 11.5–15.5)
WBC: 16.1 10*3/uL — ABNORMAL HIGH (ref 4.0–10.5)
nRBC: 0 % (ref 0.0–0.2)

## 2018-02-15 LAB — COMPREHENSIVE METABOLIC PANEL
ALK PHOS: 42 U/L (ref 38–126)
ALT: 18 U/L (ref 0–44)
AST: 28 U/L (ref 15–41)
Albumin: 5.1 g/dL — ABNORMAL HIGH (ref 3.5–5.0)
Anion gap: 12 (ref 5–15)
BUN: 18 mg/dL (ref 6–20)
CHLORIDE: 110 mmol/L (ref 98–111)
CO2: 23 mmol/L (ref 22–32)
CREATININE: 0.82 mg/dL (ref 0.61–1.24)
Calcium: 9.7 mg/dL (ref 8.9–10.3)
GFR calc Af Amer: >60 mL/min (ref >60)
Glucose, Bld: 148 mg/dL — ABNORMAL HIGH (ref 70–99)
Potassium: 3.7 mmol/L (ref 3.5–5.1)
SODIUM: 145 mmol/L (ref 135–145)
Total Bilirubin: 1 mg/dL (ref 0.3–1.2)
Total Protein: 8.1 g/dL (ref 6.5–8.1)

## 2018-02-15 LAB — LIPASE, BLOOD: LIPASE: 28 U/L (ref 11–51)

## 2018-02-15 MED ORDER — FAMOTIDINE 20 MG PO TABS
40.0000 mg | ORAL_TABLET | Freq: Once | ORAL | Status: AC
  Administered 2018-02-15: 40 mg via ORAL
  Filled 2018-02-15: qty 2

## 2018-02-15 MED ORDER — SODIUM CHLORIDE 0.9 % IV BOLUS
1000.0000 mL | Freq: Once | INTRAVENOUS | Status: AC
  Administered 2018-02-15: 1000 mL via INTRAVENOUS

## 2018-02-15 MED ORDER — ONDANSETRON HCL 4 MG/2ML IJ SOLN
4.0000 mg | Freq: Once | INTRAMUSCULAR | Status: AC
  Administered 2018-02-15: 4 mg via INTRAVENOUS
  Filled 2018-02-15: qty 2

## 2018-02-15 MED ORDER — ONDANSETRON 4 MG PO TBDP: 4.0000 mg | ORAL_TABLET | Freq: Three times a day (TID) | ORAL | 0 refills | Status: DC | PRN

## 2018-02-15 MED ORDER — FAMOTIDINE 20 MG PO TABS: 20.0000 mg | ORAL_TABLET | Freq: Two times a day (BID) | ORAL | 0 refills | Status: AC

## 2018-02-15 MED ORDER — FAMOTIDINE IN NACL 20-0.9 MG/50ML-% IV SOLN
20.0000 mg | Freq: Once | INTRAVENOUS | Status: AC
  Administered 2018-02-15: 20 mg via INTRAVENOUS
  Filled 2018-02-15: qty 50

## 2018-02-15 MED FILL — ONDANSETRON ODT 4 MG TABLET: 4 | 3 days supply | Qty: 10 | Fill #0

## 2018-02-15 MED FILL — FAMOTIDINE 20 MG TABLET: 20 | 5 days supply | Qty: 10 | Fill #0

## 2018-02-15 NOTE — ED Notes (Addendum)
Water and soda given at this time

## 2018-02-15 NOTE — ED Provider Notes (Signed)
[facility]-EMERGENCY DEPT Provider Note   CSN: [id number] Arrival date & time: 02/15/18  [id number]     History   Chief Complaint Chief Complaint  Patient presents with  . Abdominal Pain  . Diarrhea  . Vomiting    HPI Derek Lynn is a 27 y.o. male.  Patient presents to the emergency department today with acute onset of nausea, vomiting, and diarrhea starting at 2 AM.  Patient has no past surgical history.  He reports eating at a restaurant last night.  He denies any other sick contacts.  Pain is in the epigastrium and does not radiate.  He has had associated nonbilious vomiting that is persistent.  He has had watery stool, repeatedly, that does not contain any blood.  No urinary symptoms.  No treatments prior to arrival.  He denies heavy NSAID or alcohol use.     History reviewed. No pertinent past medical history.  Patient Active Problem List   Diagnosis Date Noted  . MDD (major depressive disorder), recurrent episode, moderate (HCC) 06/17/2017  . GAD (generalized anxiety disorder) 08/15/2016  . Aggression   . Behavior concern in adult     History reviewed. No pertinent surgical history.      Home Medications    Prior to Admission medications   Medication Sig Start Date End Date Taking? Authorizing Provider  sertraline (ZOLOFT) 50 MG tablet 25 mg at night for one week, then 50 mg at night 06/17/17   Neysa HotterHisada, Reina, MD    Family History No family history on file.  Social History Social History   Tobacco Use  . Smoking status: Former Smoker    Types: Cigarettes  . Smokeless tobacco: Never Used  Substance Use Topics  . Alcohol use: No    Frequency: Never  . Drug use: No    Types: Marijuana    Comment: last use over a month ago     Allergies   Patient has no known allergies.   Review of Systems Review of Systems  Constitutional: Negative for fever.  HENT: Negative for rhinorrhea and sore throat.   Eyes: Negative for redness.    Respiratory: Negative for cough.   Cardiovascular: Negative for chest pain.  Gastrointestinal: Positive for abdominal pain, diarrhea, nausea and vomiting.  Genitourinary: Negative for dysuria.  Musculoskeletal: Negative for myalgias.  Skin: Negative for rash.  Neurological: Negative for headaches.     Physical Exam Updated Vital Signs BP 138/87 (BP Location: Left Arm)   Pulse 67   Temp 98 F (36.7 C) (Oral)   Resp 18   SpO2 99%   Physical Exam  Constitutional: He appears well-developed and well-nourished.  HENT:  Head: Normocephalic and atraumatic.  Eyes: Conjunctivae are normal. Right eye exhibits no discharge. Left eye exhibits no discharge.  Neck: Normal range of motion. Neck supple.  Cardiovascular: Normal rate, regular rhythm and normal heart sounds.  Pulmonary/Chest: Effort normal and breath sounds normal.  Abdominal: Soft. There is tenderness in the epigastric area. There is no rebound, no guarding, no tenderness at McBurney's point and negative Murphy's sign.  Neurological: He is alert.  Skin: Skin is warm and dry.  Psychiatric: He has a normal mood and affect.  Nursing note and vitals reviewed.    ED Treatments / Results  Labs (all labs ordered are listed, but only abnormal results are displayed) Labs Reviewed  LIPASE, BLOOD  COMPREHENSIVE METABOLIC PANEL  CBC  URINALYSIS, ROUTINE W REFLEX MICROSCOPIC    EKG None  Radiology  No results found.  Procedures Procedures (including critical care time)  Medications Ordered in ED Medications  ondansetron (ZOFRAN) injection 4 mg (has no administration in time range)  famotidine (PEPCID) IVPB 20 mg premix (has no administration in time range)  sodium chloride 0.9 % bolus 1,000 mL (0 mLs Intravenous Stopped 02/15/18 0933)  ondansetron (ZOFRAN) injection 4 mg (4 mg Intravenous Given 02/15/18 0829)  famotidine (PEPCID) tablet 40 mg (40 mg Oral Given 02/15/18 1034)     Initial Impression / Assessment and  Plan / ED Course  I have reviewed the triage vital signs and the nursing notes.  Pertinent labs & imaging results that were available during my care of the patient were reviewed by me and considered in my medical decision making (see chart for details).     Patient seen and examined. Work-up initiated. Medications ordered.  Patient with acute onset of nausea, vomiting, and diarrhea.  Abdominal exam is reassuring.  Labs pending.  Bolus and Zofran ordered.  Vital signs reviewed and are as follows: BP 138/87 (BP Location: Left Arm)   Pulse 67   Temp 98 F (36.7 C) (Oral)   Resp 18   SpO2 99%   11:09 AM on earlier recheck, patient was feeling better.  Minimal epigastric pain.  Improved nausea, vomiting has stopped.  Patient was given Pepcid.  After this, he vomited.  He has had a little bit more to drink.  Will give additional antiemetics and reassess.  On abdominal exam, abdomen is soft and minimally tender in the epigastrium.  No peritoneal signs.  No lower abdominal tenderness or right upper quadrant tenderness.  12:43 PM patient doing better.  Ready for discharge.  We discussed clear liquid diet for the next 24 hours and brat diet and slow advancement after that.  The patient was urged to return to the Emergency Department immediately with worsening of current symptoms, worsening abdominal pain, persistent vomiting, blood noted in stools, fever, or any other concerns.  Also discussed return for recheck if symptoms are not improved or worsen in the next 24 hours.  The patient verbalized understanding.    Final Clinical Impressions(s) / ED Diagnoses   Final diagnoses:  Nausea vomiting and diarrhea   Patient with acute onset of N/V/D symptoms consistent at this time with viral gastroenteritis. Vitals are stable, no fever.  Patient has a leukocytosis however this is not unexpected with his active symptoms.  No signs of dehydration, tolerating PO's. Lungs are clear. No focal abdominal pain.  Low concern for appendicitis, cholecystitis, pancreatitis, ruptured viscus, UTI, kidney stone, aortic dissection, aortic aneurysm or other emergent abdominal etiology. Supportive therapy indicated with return if symptoms worsen. Patient counseled.   ED Discharge Orders         Ordered    ondansetron (ZOFRAN ODT) 4 MG disintegrating tablet  Every 8 hours PRN     02/15/18 1103    famotidine (PEPCID) 20 MG tablet  2 times daily     02/15/18 1103           Renne Crigler, PA-C 02/15/18 1244    Tegeler, Canary Brim, MD 02/15/18 [phone]

## 2018-02-15 NOTE — ED Triage Notes (Signed)
Pt c/o bad pains with n/v/d since around 2 am.

## 2018-02-15 NOTE — ED Notes (Signed)
PT made aware urine sample still pending. Urinal in hand

## 2018-02-15 NOTE — ED Notes (Signed)
Pt made aware of urine sample order.unable to provide at this time

## 2018-02-15 NOTE — Discharge Instructions (Signed)
Please read and follow all provided instructions.  Your diagnoses today include:  1. Nausea vomiting and diarrhea     Tests performed today include:  Blood counts and electrolytes - high white blood cells which is expected  Blood tests to check liver and kidney function  Blood tests to check pancreas function  Urine test to look for infection  Vital signs. See below for your results today.   Medications prescribed:   Zofran (ondansetron) - for nausea and vomiting   Pepcid (famotidine) - antihistamine  You can find this medication over-the-counter.   DO NOT exceed:   20mg  Pepcid every 12 hours  Take any prescribed medications only as directed.  Home care instructions:   Follow any educational materials contained in this packet.   Your abdominal pain, nausea, vomiting, and diarrhea may be caused by a viral gastroenteritis also called 'stomach flu'. You should rest for the next several days. Keep drinking plenty of fluids and use the medicine for nausea as directed.    Drink clear liquids for the next 24 hours and introduce solid foods slowly after 24 hours using the b.r.a.t. diet (Bananas, Rice, Applesauce, Toast, Yogurt).    Follow-up instructions: Please follow-up with your primary care provider in the next 2 days for further evaluation of your symptoms. If you are not feeling better in 48 hours you may have a condition that is more serious and you need re-evaluation.   Return instructions:  SEEK IMMEDIATE MEDICAL ATTENTION IF:  If you have pain that does not go away or becomes severe   A temperature above 101F develops   Repeated vomiting occurs (multiple episodes)   If you have pain that becomes localized to portions of the abdomen. The right side could possibly be appendicitis. In an adult, the left lower portion of the abdomen could be colitis or diverticulitis.   Blood is being passed in stools or vomit (bright red or black tarry stools)   You develop  chest pain, difficulty breathing, dizziness or fainting, or become confused, poorly responsive, or inconsolable (young children)  If you have any other emergent concerns regarding your health  Additional Information: Abdominal (belly) pain can be caused by many things. Your caregiver performed an examination and possibly ordered blood/urine tests and imaging (CT scan, x-rays, ultrasound). Many cases can be observed and treated at home after initial evaluation in the emergency department. Even though you are being discharged home, abdominal pain can be unpredictable. Therefore, you need a repeated exam if your pain does not resolve, returns, or worsens. Most patients with abdominal pain don't have to be admitted to the hospital or have surgery, but serious problems like appendicitis and gallbladder attacks can start out as nonspecific pain. Many abdominal conditions cannot be diagnosed in one visit, so follow-up evaluations are very important.  Your vital signs today were: BP 125/65    Pulse 60    Temp 98 F (36.7 C) (Oral)    Resp 18    SpO2 100%  If your blood pressure (bp) was elevated above 135/85 this visit, please have this repeated by your doctor within one month. --------------

## 2019-02-16 DIAGNOSIS — L309 Dermatitis, unspecified: Secondary | ICD-10-CM | POA: Diagnosis not present

## 2019-02-16 MED FILL — predniSONE 10 MG TABS: 10 | 6 days supply | Qty: 21 | Fill #0

## 2021-07-04 ENCOUNTER — Emergency Department (HOSPITAL_COMMUNITY): Payer: Self-pay

## 2021-07-04 ENCOUNTER — Emergency Department (HOSPITAL_COMMUNITY)
Admission: EM | Admit: 2021-07-04 | Discharge: 2021-07-04 | Disposition: A | Discharge status: Home / Self Care | Payer: Self-pay | Attending: Emergency Medicine | Admitting: Emergency Medicine

## 2021-07-04 ENCOUNTER — Encounter (HOSPITAL_COMMUNITY): Payer: Self-pay

## 2021-07-04 DIAGNOSIS — R1033 Periumbilical pain: Secondary | ICD-10-CM

## 2021-07-04 DIAGNOSIS — R7989 Other specified abnormal findings of blood chemistry: Secondary | ICD-10-CM | POA: Insufficient documentation

## 2021-07-04 DIAGNOSIS — D72829 Elevated white blood cell count, unspecified: Secondary | ICD-10-CM | POA: Insufficient documentation

## 2021-07-04 DIAGNOSIS — K6389 Other specified diseases of intestine: Secondary | ICD-10-CM | POA: Diagnosis not present

## 2021-07-04 DIAGNOSIS — R1013 Epigastric pain: Secondary | ICD-10-CM | POA: Diagnosis not present

## 2021-07-04 DIAGNOSIS — R109 Unspecified abdominal pain: Secondary | ICD-10-CM | POA: Diagnosis not present

## 2021-07-04 DIAGNOSIS — R1084 Generalized abdominal pain: Secondary | ICD-10-CM | POA: Insufficient documentation

## 2021-07-04 DIAGNOSIS — R945 Abnormal results of liver function studies: Secondary | ICD-10-CM | POA: Diagnosis not present

## 2021-07-04 LAB — URINALYSIS, ROUTINE W REFLEX MICROSCOPIC
Bilirubin Urine: NEGATIVE
Glucose, UA: NEGATIVE mg/dL
Hgb urine dipstick: NEGATIVE
Ketones, ur: NEGATIVE mg/dL
Leukocytes,Ua: NEGATIVE
Nitrite: NEGATIVE
Protein, ur: NEGATIVE mg/dL
Specific Gravity, Urine: 1.04 — ABNORMAL HIGH (ref 1.005–1.030)
pH: 7 (ref 5.0–8.0)

## 2021-07-04 LAB — CBC WITH DIFFERENTIAL/PLATELET
Abs Immature Granulocytes: 0.05 10*3/uL (ref 0.00–0.07)
Basophils Absolute: 0 10*3/uL (ref 0.0–0.1)
Basophils Relative: 0 %
Eosinophils Absolute: 0.3 10*3/uL (ref 0.0–0.5)
Eosinophils Relative: 2 %
HCT: 42.7 % (ref 39.0–52.0)
Hemoglobin: 15.1 g/dL (ref 13.0–17.0)
Immature Granulocytes: 0 %
Lymphocytes Relative: 10 %
Lymphs Abs: 1.2 10*3/uL (ref 0.7–4.0)
MCH: 31.8 pg (ref 26.0–34.0)
MCHC: 35.4 g/dL (ref 30.0–36.0)
MCV: 89.9 fL (ref 80.0–100.0)
Monocytes Absolute: 0.7 10*3/uL (ref 0.1–1.0)
Monocytes Relative: 6 %
Neutro Abs: 10.2 10*3/uL — ABNORMAL HIGH (ref 1.7–7.7)
Neutrophils Relative %: 82 %
Platelets: 199 10*3/uL (ref 150–400)
RBC: 4.75 MIL/uL (ref 4.22–5.81)
RDW: 11.7 % (ref 11.5–15.5)
WBC: 12.4 10*3/uL — ABNORMAL HIGH (ref 4.0–10.5)
nRBC: 0 % (ref 0.0–0.2)

## 2021-07-04 LAB — COMPREHENSIVE METABOLIC PANEL
ALT: 48 U/L — ABNORMAL HIGH (ref 0–44)
AST: 83 U/L — ABNORMAL HIGH (ref 15–41)
Albumin: 4.7 g/dL (ref 3.5–5.0)
Alkaline Phosphatase: 38 U/L (ref 38–126)
Anion gap: 8 (ref 5–15)
BUN: 15 mg/dL (ref 6–20)
CO2: 29 mmol/L (ref 22–32)
Calcium: 9.2 mg/dL (ref 8.9–10.3)
Chloride: 102 mmol/L (ref 98–111)
Creatinine, Ser: 0.81 mg/dL (ref 0.61–1.24)
GFR, Estimated: >60 mL/min (ref >60)
Glucose, Bld: 143 mg/dL — ABNORMAL HIGH (ref 70–99)
Potassium: 3.3 mmol/L — ABNORMAL LOW (ref 3.5–5.1)
Sodium: 139 mmol/L (ref 135–145)
Total Bilirubin: 0.5 mg/dL (ref 0.3–1.2)
Total Protein: 7.6 g/dL (ref 6.5–8.1)

## 2021-07-04 LAB — LIPASE, BLOOD: Lipase: 37 U/L (ref 11–51)

## 2021-07-04 MED ORDER — HYDROCODONE-ACETAMINOPHEN 5-325 MG PO TABS: 1.0000 | ORAL_TABLET | ORAL | 0 refills | Status: AC | PRN

## 2021-07-04 MED ORDER — IOHEXOL 300 MG/ML  SOLN
100.0000 mL | Freq: Once | INTRAMUSCULAR | Status: AC | PRN
  Administered 2021-07-04: 100 mL via INTRAVENOUS

## 2021-07-04 MED ORDER — KETOROLAC TROMETHAMINE 30 MG/ML IJ SOLN: 30.0000 mg | Freq: Once | INTRAMUSCULAR | Status: DC

## 2021-07-04 MED ORDER — ONDANSETRON 4 MG PO TBDP: 4.0000 mg | ORAL_TABLET | Freq: Three times a day (TID) | ORAL | 0 refills | Status: AC | PRN

## 2021-07-04 MED ORDER — SODIUM CHLORIDE 0.9 % IV BOLUS
1000.0000 mL | Freq: Once | INTRAVENOUS | Status: AC
  Administered 2021-07-04: 1000 mL via INTRAVENOUS

## 2021-07-04 MED ORDER — SODIUM CHLORIDE (PF) 0.9 % IJ SOLN
INTRAMUSCULAR | Status: AC
  Filled 2021-07-04: qty 50

## 2021-07-04 MED ORDER — ONDANSETRON HCL 4 MG/2ML IJ SOLN
4.0000 mg | Freq: Once | INTRAMUSCULAR | Status: AC
  Administered 2021-07-04: 4 mg via INTRAVENOUS
  Filled 2021-07-04: qty 2

## 2021-07-04 MED ORDER — HYDROCODONE-ACETAMINOPHEN 5-325 MG PO TABS: 1.0000 | ORAL_TABLET | ORAL | 0 refills | Status: DC | PRN

## 2021-07-04 MED ORDER — MORPHINE SULFATE (PF) 4 MG/ML IV SOLN
4.0000 mg | Freq: Once | INTRAVENOUS | Status: AC
  Administered 2021-07-04: 4 mg via INTRAVENOUS
  Filled 2021-07-04: qty 1

## 2021-07-04 MED ORDER — ONDANSETRON 4 MG PO TBDP: 4.0000 mg | ORAL_TABLET | Freq: Three times a day (TID) | ORAL | 0 refills | Status: DC | PRN

## 2021-07-04 NOTE — ED Notes (Signed)
P.O. Challenge done. Patient tolerated well. Denies N/V. ?

## 2021-07-04 NOTE — Discharge Instructions (Addendum)
Take medication as prescribed ? ?Follow-up outpatient for HIDA scan ? ?Return for new or worsening symptoms. ?

## 2021-07-04 NOTE — ED Provider Notes (Signed)
?[facility]-EMERGENCY DEPT ?Provider Note ? ? ?CSN: [id number] ?Arrival date & time: 07/04/21  1350 ? ?  ? ?History ? ?Chief Complaint  ?Patient presents with  ? Abdominal Pain  ? Chest Pain  ? ? ?Derek Lynn is a 31 y.o. male. With no pertinent medical history who presents to the emergency department for abdominal pain. ? ?Patient states that about 1 hour ago he had a sudden onset of severe abdominal pain that he describes as "all over" and pain that goes down his back.  He states that the pain has been constant.  No aggravating or alleviating factors.  He has had associated nausea with vomiting with the pain.  He has associated chills with the pain.  He denies recent fever or illnesses.  Denies dysuria, hematuria, testicular pain.  He denies alcohol use or history of peptic ulcer disease. ?Father at bedside states that the patient laid down for a nap asymptomatic and woke up out of sleep with a severe abdominal pain. ? ?HPI ? ?  ? ?Home Medications ?Prior to Admission medications   ?Medication Sig Start Date End Date Taking? Authorizing Provider  ?famotidine (PEPCID) 20 MG tablet Take 1 tablet (20 mg total) by mouth 2 (two) times daily. 02/15/18   Renne Crigler, PA-C  ?ondansetron (ZOFRAN ODT) 4 MG disintegrating tablet Take 1 tablet (4 mg total) by mouth every 8 (eight) hours as needed for nausea or vomiting. 02/15/18   Renne Crigler, PA-C  ?   ? ?Allergies    ?Patient has no known allergies.   ? ?Review of Systems   ?Review of Systems  ?Constitutional:  Positive for chills.  ?Respiratory:  Negative for shortness of breath.   ?Cardiovascular:  Negative for chest pain and palpitations.  ?Gastrointestinal:  Positive for abdominal pain, nausea and vomiting.  ?Genitourinary:  Negative for dysuria, flank pain and testicular pain.  ?Musculoskeletal:  Positive for back pain.  ?Neurological:  Negative for syncope.  ?All other systems reviewed and are negative. ? ?Physical Exam ?Updated Vital  Signs ?BP (!) 155/87 (BP Location: Right Arm)   Pulse 84   Temp 97.6 ?F (36.4 ?C) (Oral)   Resp (!) 22   Ht 5\' 10"  (1.778 m)   Wt 81.6 kg   SpO2 100%   BMI 25.83 kg/m?  ?Physical Exam ?Vitals and nursing note reviewed.  ?Constitutional:   ?   General: He is in acute distress.  ?   Appearance: Normal appearance. He is well-developed. He is ill-appearing. He is not toxic-appearing or diaphoretic.  ?HENT:  ?   Head: Normocephalic and atraumatic.  ?   Nose: Nose normal.  ?   Mouth/Throat:  ?   Mouth: Mucous membranes are moist.  ?   Pharynx: Oropharynx is clear.  ?Eyes:  ?   General: No scleral icterus. ?   Extraocular Movements: Extraocular movements intact.  ?Cardiovascular:  ?   Rate and Rhythm: Normal rate and regular rhythm.  ?   Heart sounds: Normal heart sounds. No murmur heard. ?Pulmonary:  ?   Effort: Pulmonary effort is normal. No respiratory distress.  ?   Breath sounds: Normal breath sounds.  ?Abdominal:  ?   General: Abdomen is flat. Bowel sounds are decreased. There is no distension.  ?   Palpations: Abdomen is soft.  ?   Tenderness: There is generalized abdominal tenderness. There is guarding. There is no right CVA tenderness or left CVA tenderness.  ?   Hernia: No hernia is present.  ?  Musculoskeletal:     ?   General: Normal range of motion.  ?   Cervical back: Neck supple.  ?Skin: ?   General: Skin is warm and dry.  ?   Capillary Refill: Capillary refill takes less than 2 seconds.  ?   Coloration: Skin is pale.  ?Neurological:  ?   General: No focal deficit present.  ?   Mental Status: He is alert and oriented to person, place, and time.  ?Psychiatric:     ?   Mood and Affect: Mood normal.     ?   Behavior: Behavior normal.  ? ? ?ED Results / Procedures / Treatments   ?Labs ?(all labs ordered are listed, but only abnormal results are displayed) ?Labs Reviewed  ?CBC WITH DIFFERENTIAL/PLATELET - Abnormal; Notable for the following components:  ?    Result Value  ? WBC 12.4 (*)   ? Neutro Abs 10.2  (*)   ? All other components within normal limits  ?URINALYSIS, ROUTINE W REFLEX MICROSCOPIC  ?COMPREHENSIVE METABOLIC PANEL  ?LIPASE, BLOOD  ? ?EKG ?None ? ?Radiology ?CT Renal Stone Study ? ?Result Date: 07/04/2021 ?CLINICAL DATA:  Flank pain with nausea and vomiting EXAM: CT ABDOMEN AND PELVIS WITHOUT CONTRAST TECHNIQUE: Multidetector CT imaging of the abdomen and pelvis was performed following the standard protocol without IV contrast. RADIATION DOSE REDUCTION: This exam was performed according to the departmental dose-optimization program which includes automated exposure control, adjustment of the mA and/or kV according to patient size and/or use of iterative reconstruction technique. COMPARISON:  None. FINDINGS: Lower chest: Included lung bases are clear.  Heart size is normal. Hepatobiliary: Unremarkable unenhanced appearance of the liver. No focal liver lesion identified. Gallbladder within normal limits. No hyperdense gallstone. No biliary dilatation. Pancreas: Unremarkable. No pancreatic ductal dilatation or surrounding inflammatory changes. Spleen: Normal in size without focal abnormality. Adrenals/Urinary Tract: Adrenal glands are unremarkable. Kidneys are normal, without renal calculi, focal lesion, or hydronephrosis. Bladder is unremarkable. Stomach/Bowel: Stomach within normal limits. Mild long segment thickening of the ascending and transverse colon. No dilated loops of bowel. Appendix not definitively seen. No pericecal inflammatory changes. Vascular/Lymphatic: No significant vascular findings are present. No enlarged abdominal or pelvic lymph nodes. Reproductive: Prostate is unremarkable. Other: Trace free fluid within the pelvis. No abdominopelvic fluid collection. No pneumoperitoneum. No abdominal wall hernia. Musculoskeletal: No acute or significant osseous findings. IMPRESSION: 1. Mild long segment thickening of the ascending and transverse colon, suggestive of an infectious or inflammatory  colitis. 2. Trace free fluid within the pelvis, which may be reactive. 3. No evidence of obstructive uropathy. Electronically Signed   By: Duanne Guess D.O.   On: 07/04/2021 14:51   ? ?Procedures ?Procedures  ? ?Medications Ordered in ED ?Medications  ?sodium chloride 0.9 % bolus 1,000 mL (has no administration in time range)  ?morphine (PF) 4 MG/ML injection 4 mg (has no administration in time range)  ?ondansetron (ZOFRAN) injection 4 mg (has no administration in time range)  ? ?ED Course/ Medical Decision Making/ A&P ?  ?                        ?Medical Decision Making ?Amount and/or Complexity of Data Reviewed ?Labs: ordered. ?Radiology: ordered. ? ?Risk ?Prescription drug management. ? ?31 year old male who presents to the emergency department with sudden onset of abdominal pain.  He was in acute distress on my initial exam with guarding.  Placed broad labs as well as CT renal stone  study which was negative.  It does show some possible inflammatory or infectious colitis. ?1505: Reevaluated the patient and he appears much more comfortable after 4 mg morphine, 4 mg Zofran and starting fluids.  He is able to verbalize a bit better about his abdominal pain and states that it is mostly epigastric abdominal pain but more left-sided and goes around the left side of his back. ? ?Care of patient being handed off to [place], PA-C.  Patient is still pending labs.  If there is no obvious source of abdominal pain on laboratory studies the patient may need CT abdomen pelvis with contrast to evaluate further his abdominal pain.  If everything is negative he can be discharged home with nausea medication.  Otherwise, will be treated for any acute findings. ?Final Clinical Impression(s) / ED Diagnoses ?Final diagnoses:  ?None  ? ? ?Rx / DC Orders ?ED Discharge Orders   ? ? None  ? ?  ? ? ?  ?Cristopher PeruAutry, Gayle Collard E, PA-C ?07/04/21 1510 ? ?  ?Margarita Grizzleay, Danielle, MD ?07/08/21 2315 ? ?

## 2021-07-04 NOTE — ED Triage Notes (Signed)
Pt arrived via POV, c/o diffuse abd pain, radiating up to chest and upper back that started approx 2 hrs ago. Also endorses nausea/vomiting. Denies any diarrhea or urinary sx.  ?

## 2021-07-04 NOTE — ED Notes (Signed)
Patient was made aware we need a urine sample. Urinal at bedside.  ?

## 2021-07-04 NOTE — ED Provider Notes (Signed)
Seen from previous provider.  See note for full HPI. ? ?Patient follow-up on labs and imaging.  Dispo Pending ?Physical Exam  ?BP 122/74   Pulse (!) 44   Temp 97.6 ?F (36.4 ?C) (Oral)   Resp 17   Ht 5\' 10"  (1.778 m)   Wt 81.6 kg   SpO2 98%   BMI 25.83 kg/m?  ? ?Physical Exam ?Vitals and nursing note reviewed.  ?Constitutional:   ?   General: He is not in acute distress. ?   Appearance: He is well-developed. He is not ill-appearing, toxic-appearing or diaphoretic.  ?HENT:  ?   Head: Atraumatic.  ?Eyes:  ?   Pupils: Pupils are equal, round, and reactive to light.  ?Cardiovascular:  ?   Rate and Rhythm: Normal rate and regular rhythm.  ?Pulmonary:  ?   Effort: Pulmonary effort is normal. No respiratory distress.  ?Abdominal:  ?   General: There is no distension.  ?   Palpations: Abdomen is soft.  ?   Tenderness: There is abdominal tenderness in the periumbilical area, left upper quadrant and left lower quadrant. There is no right CVA tenderness, left CVA tenderness, guarding or rebound. Negative signs include Murphy's sign and McBurney's sign.  ?   Hernia: No hernia is present.  ? ? ?Musculoskeletal:     ?   General: Normal range of motion.  ?   Cervical back: Normal range of motion and neck supple.  ?Skin: ?   General: Skin is warm and dry.  ?Neurological:  ?   General: No focal deficit present.  ?   Mental Status: He is alert and oriented to person, place, and time.  ? ? ?Procedures  ?Procedures ?Labs Reviewed  ?URINALYSIS, ROUTINE W REFLEX MICROSCOPIC - Abnormal; Notable for the following components:  ?    Result Value  ? Specific Gravity, Urine 1.040 (*)   ? All other components within normal limits  ?COMPREHENSIVE METABOLIC PANEL - Abnormal; Notable for the following components:  ? Potassium 3.3 (*)   ? Glucose, Bld 143 (*)   ? AST 83 (*)   ? ALT 48 (*)   ? All other components within normal limits  ?CBC WITH DIFFERENTIAL/PLATELET - Abnormal; Notable for the following components:  ? WBC 12.4 (*)   ? Neutro  Abs 10.2 (*)   ? All other components within normal limits  ?LIPASE, BLOOD  ?HEPATITIS PANEL, ACUTE  ? ?CT ABDOMEN PELVIS W CONTRAST ? ?Result Date: 07/04/2021 ?CLINICAL DATA:  Left lower quadrant and periumbilical abdominal pain with persistent emesis. EXAM: CT ABDOMEN AND PELVIS WITH CONTRAST TECHNIQUE: Multidetector CT imaging of the abdomen and pelvis was performed using the standard protocol following bolus administration of intravenous contrast. RADIATION DOSE REDUCTION: This exam was performed according to the departmental dose-optimization program which includes automated exposure control, adjustment of the mA and/or kV according to patient size and/or use of iterative reconstruction technique. CONTRAST:  09/03/2021 OMNIPAQUE IOHEXOL 300 MG/ML  SOLN COMPARISON:  Earlier same day CT 1433 hours FINDINGS: Lower chest: No acute abnormality. Hepatobiliary: No focal liver abnormality is seen. The gallbladder is mildly distended but not abnormally dilated with diffuse gallbladder wall edema. No radiopaque gallstones are seen. No bile duct dilatation. Pancreas: Unremarkable. No pancreatic ductal dilatation or surrounding inflammatory changes. Spleen: Normal in size without focal abnormality. Adrenals/Urinary Tract: Adrenal glands are unremarkable. Kidneys are normal, without renal calculi, focal lesion, or hydronephrosis. Bladder is unremarkable. Stomach/Bowel: Stomach is within normal limits. The appendix is not directly  visualized, however there are no pericecal inflammatory changes. The previously described long segment thickening of the ascending and transverse colon is not seen. No pericolonic fat stranding. No findings of bowel obstruction. Vascular/Lymphatic: No significant vascular findings. Prominent node at the porta hepatis measures 10 cm short axis (series 2, image 28). No other enlarged lymph nodes in the abdomen or pelvis. Reproductive: Prostate is unremarkable. Other: Trace simple free fluid is noted  dependently in the lower pelvis. No free intraperitoneal air. Musculoskeletal: No acute or significant osseous findings. IMPRESSION: 1. New diffuse gallbladder wall edema without radiopaque gallstone. Given patient's elevated white blood cell count and mildly elevated liver enzymes, findings could reflect acute cholecystitis. However, given the lack of sludge or gallstones on earlier same day ultrasound, findings could also reflect gallbladder edema secondary to hepatitis. HIDA scan can be considered for further evaluation. 2. Prominent lymph node at the porta hepatis, favored to be reactive in etiology. 3. The previously described long segment thickening of the ascending and transverse colon is not definitively redemonstrated. No pericolonic fat stranding to suggest acute inflammation. Electronically Signed   By: Sherron AlesLaura  Parra M.D.   On: 07/04/2021 19:39  ? ?CT Renal Stone Study ? ?Result Date: 07/04/2021 ?CLINICAL DATA:  Flank pain with nausea and vomiting EXAM: CT ABDOMEN AND PELVIS WITHOUT CONTRAST TECHNIQUE: Multidetector CT imaging of the abdomen and pelvis was performed following the standard protocol without IV contrast. RADIATION DOSE REDUCTION: This exam was performed according to the departmental dose-optimization program which includes automated exposure control, adjustment of the mA and/or kV according to patient size and/or use of iterative reconstruction technique. COMPARISON:  None. FINDINGS: Lower chest: Included lung bases are clear.  Heart size is normal. Hepatobiliary: Unremarkable unenhanced appearance of the liver. No focal liver lesion identified. Gallbladder within normal limits. No hyperdense gallstone. No biliary dilatation. Pancreas: Unremarkable. No pancreatic ductal dilatation or surrounding inflammatory changes. Spleen: Normal in size without focal abnormality. Adrenals/Urinary Tract: Adrenal glands are unremarkable. Kidneys are normal, without renal calculi, focal lesion, or  hydronephrosis. Bladder is unremarkable. Stomach/Bowel: Stomach within normal limits. Mild long segment thickening of the ascending and transverse colon. No dilated loops of bowel. Appendix not definitively seen. No pericecal inflammatory changes. Vascular/Lymphatic: No significant vascular findings are present. No enlarged abdominal or pelvic lymph nodes. Reproductive: Prostate is unremarkable. Other: Trace free fluid within the pelvis. No abdominopelvic fluid collection. No pneumoperitoneum. No abdominal wall hernia. Musculoskeletal: No acute or significant osseous findings. IMPRESSION: 1. Mild long segment thickening of the ascending and transverse colon, suggestive of an infectious or inflammatory colitis. 2. Trace free fluid within the pelvis, which may be reactive. 3. No evidence of obstructive uropathy. Electronically Signed   By: Duanne GuessNicholas  Plundo D.O.   On: 07/04/2021 14:51  ? ?US Abdomen Limited RUQ (LIVER/GB) ? ?Result Date: 07/04/2021 ?CLINICAL DATA:  Epigastric abdominal pain, elevated liver function tests EXAM: ULTRASOUND ABDOMEN LIMITED RIGHT UPPER QUADRANT COMPARISON:  None. FINDINGS: Gallbladder: No gallstones or wall thickening visualized. No sonographic Murphy sign noted by sonographer. Common bile duct: Diameter: 3 mm in proximal diameter Liver: No focal lesion identified. Within normal limits in parenchymal echogenicity. Portal vein is patent on color Doppler imaging with normal direction of blood flow towards the liver. Other: None. IMPRESSION: Normal right upper quadrant sonogram Electronically Signed   By: Helyn NumbersAshesh  Parikh M.D.   On: 07/04/2021 17:41   ? ?ED Course / MDM  ?  ? ?Patient reassessed.  He has some tenderness to his left side  of his abdomen to left upper quadrant, periumbilical and left lower quadrant given prior CT without contrast will obtain CT imaging with contrast given possible colitis on prior imaging.  Given mildly elevated LFTs with no history of this will obtain  ultrasound ? ?CT scan with possible gallbladder edema, no stones, sludge.  Possible gallbladder etiology versus acute hepatitis? ? ?Ultrasound without gallbladder wall thickening, stones or sludge.  No evidence of cholecystitis, cholelithia

## 2021-07-05 LAB — HEPATITIS PANEL, ACUTE
HCV Ab: NONREACTIVE
Hep A IgM: NONREACTIVE
Hep B C IgM: NONREACTIVE
Hepatitis B Surface Ag: NONREACTIVE

## 2022-10-30 IMAGING — US US ABDOMEN LIMITED
1 series · 14 of 25 positions shown · non-contrast
Comparison: None.

CLINICAL DATA: Epigastric abdominal pain, elevated liver function
tests

EXAM:
ULTRASOUND ABDOMEN LIMITED RIGHT UPPER QUADRANT

[Series 1: us abdomen limited · 14 of 69 slices shown]
[im 1/69]
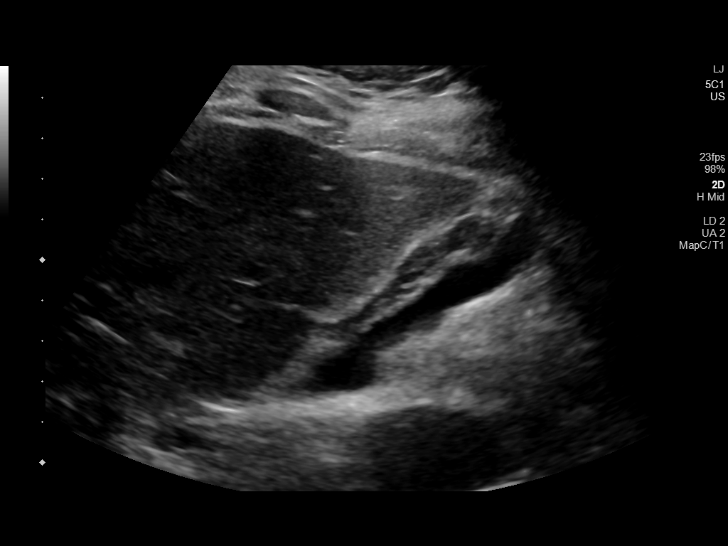
[im 6/69]
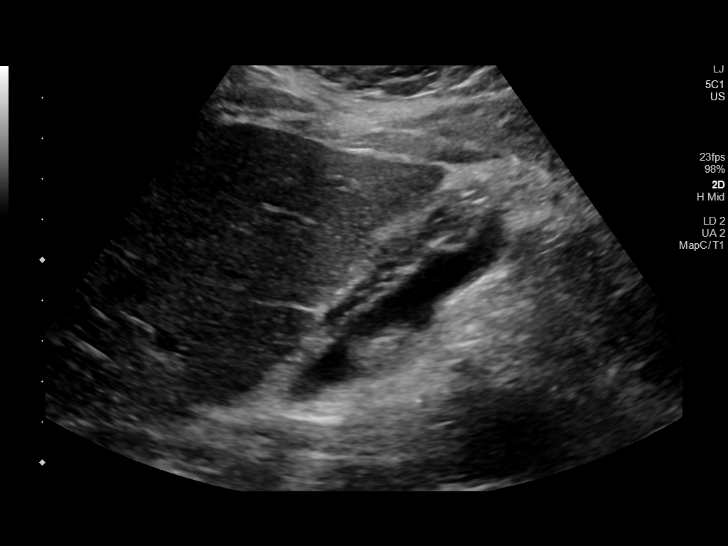
[im 12/69]
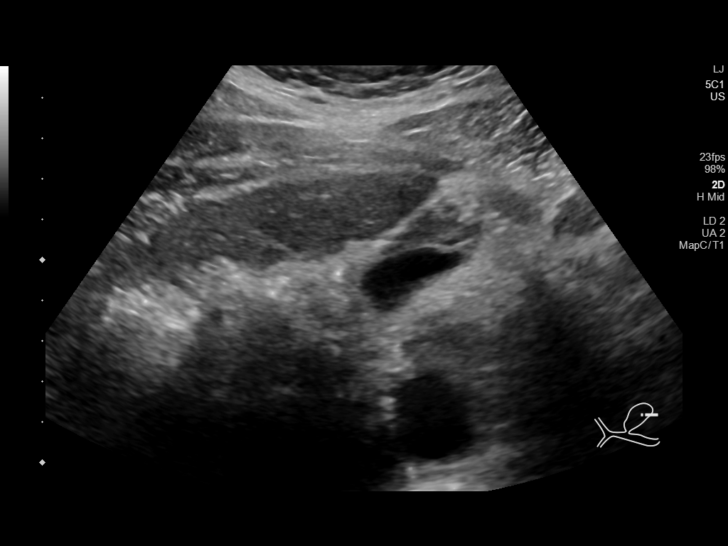
[im 18/69]
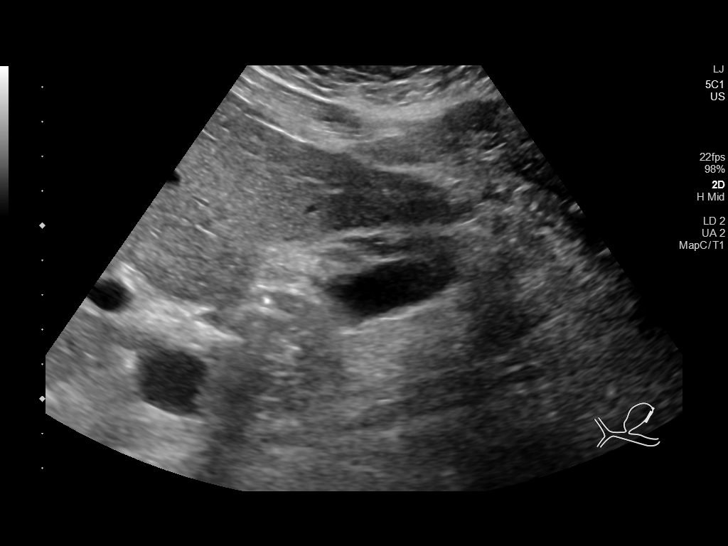
[im 23/69]
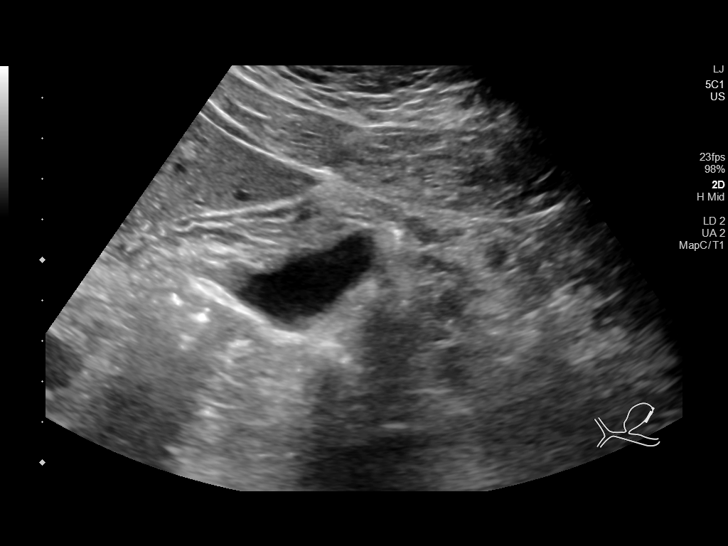
[im 26/69]
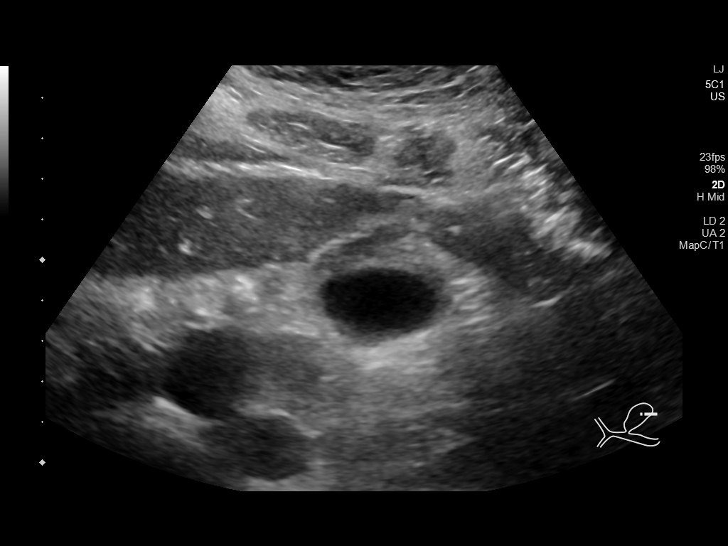
[im 32/69]
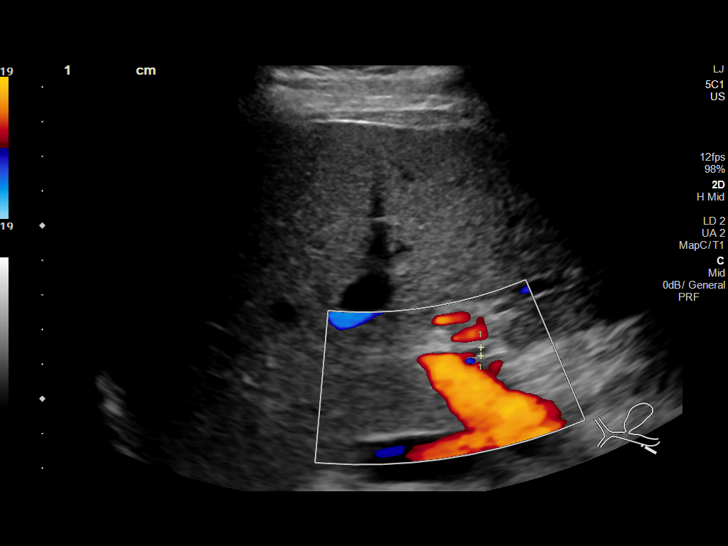
[im 37/69]
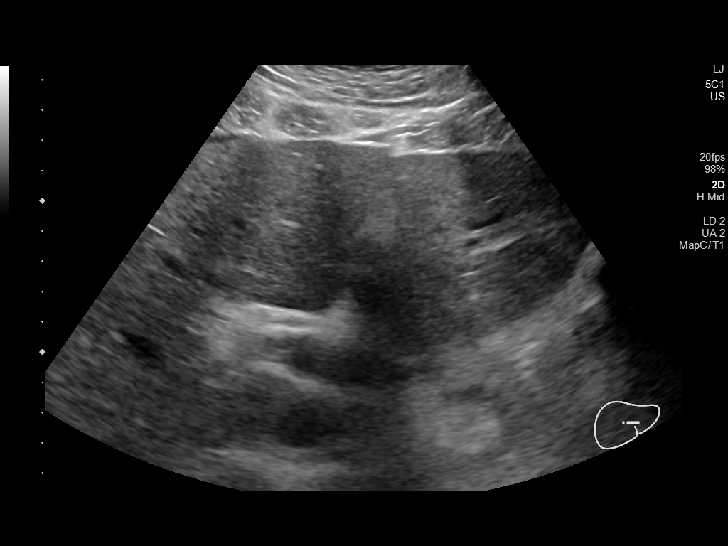
[im 43/69]
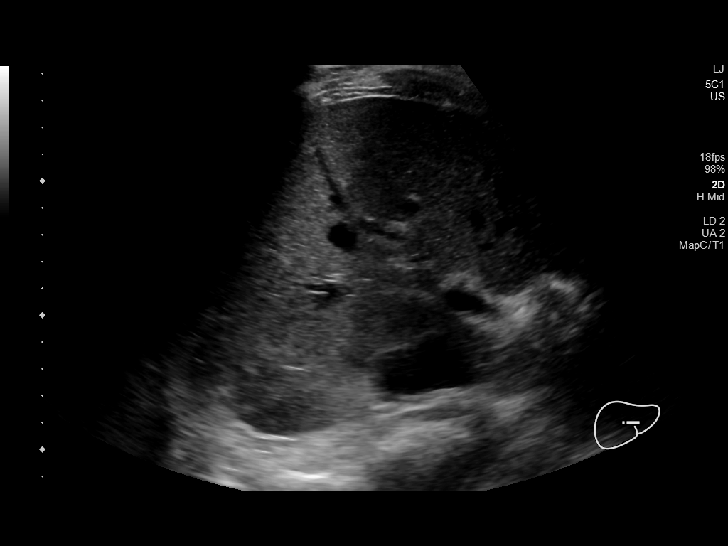
[im 46/69]
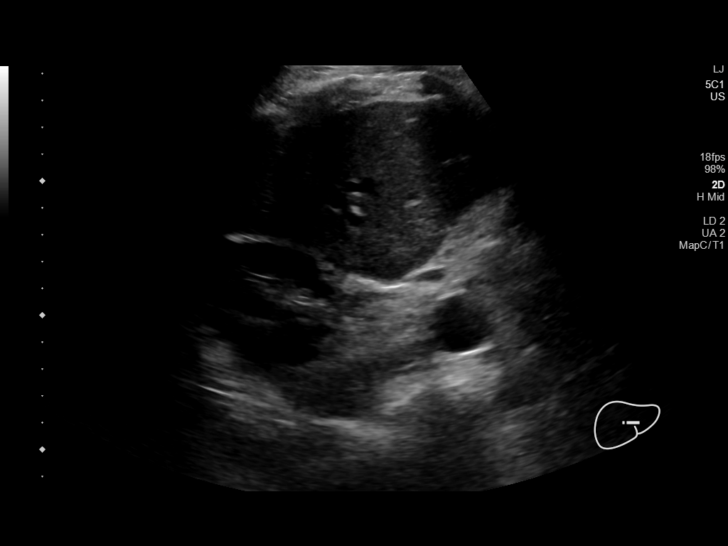
[im 52/69]
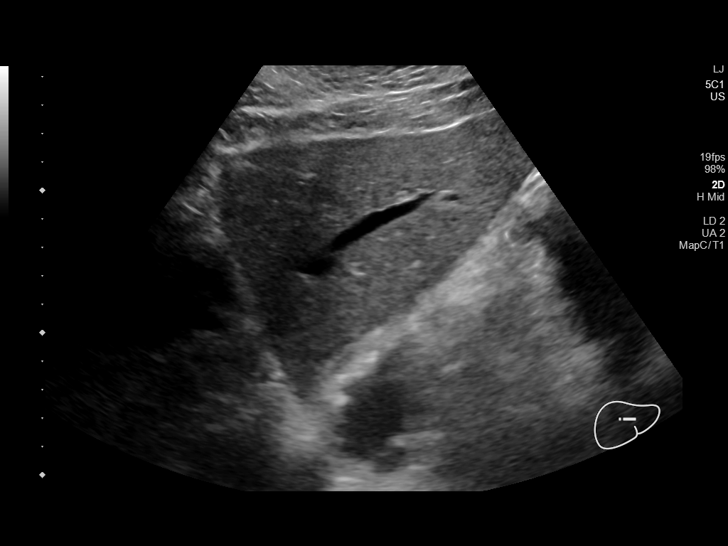
[im 57/69]
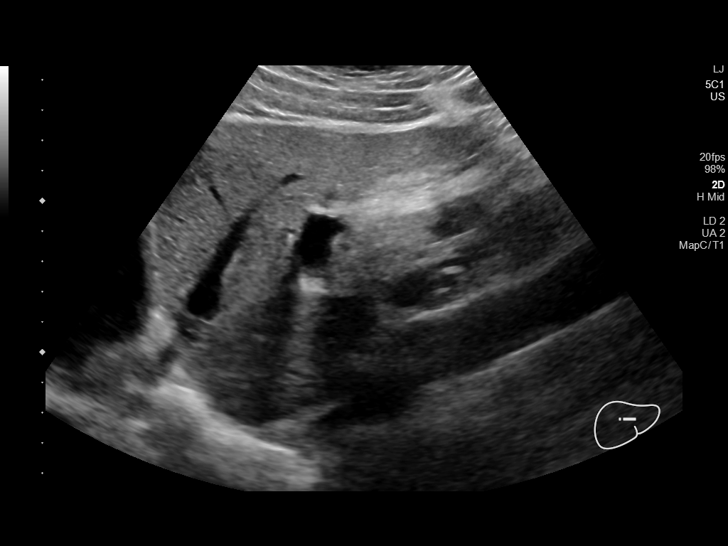
[im 63/69]
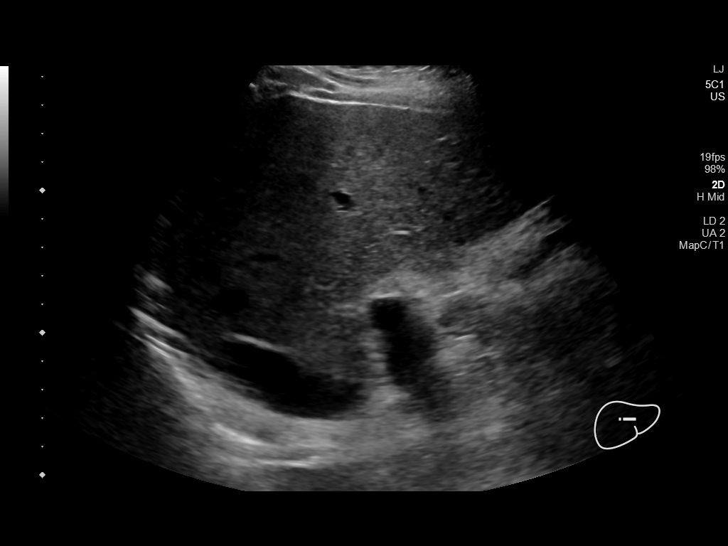
[im 69/69]
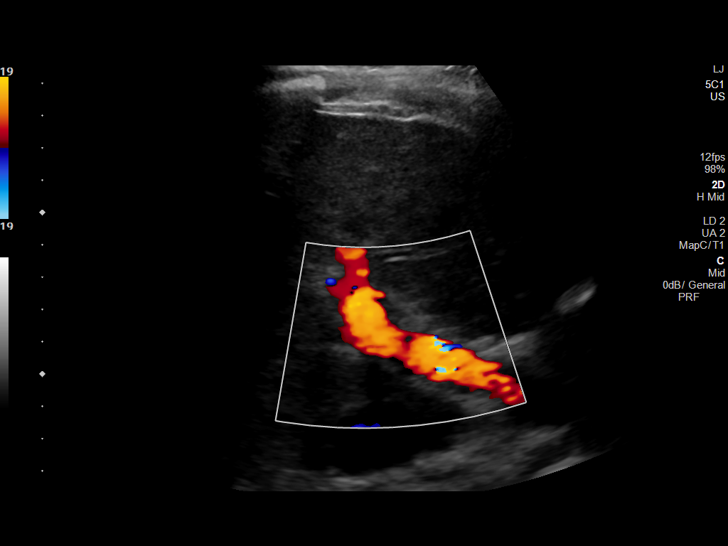

[14 of 25 positions shown; findings below may reference images not displayed]

FINDINGS: Gallbladder:

No gallstones or wall thickening visualized. No sonographic Murphy
sign noted by sonographer.

Common bile duct:

Diameter: 3 mm in proximal diameter

Liver:

No focal lesion identified. Within normal limits in parenchymal
echogenicity. Portal vein is patent on color Doppler imaging with
normal direction of blood flow towards the liver.

Other: None.
IMPRESSION: Normal right upper quadrant sonogram

## 2022-10-30 IMAGING — CT CT RENAL STONE PROTOCOL
2 of 4 series · 16 of 46 positions shown, 18 images · non-contrast
Comparison: None.

CLINICAL DATA: Flank pain with nausea and vomiting



[Series 2: axial st · axial · 0.78mm/px · z∈[+1138,+1538]mm · 13 of 90 slices shown, 15 images]
[im 5/90  soft-tissue]
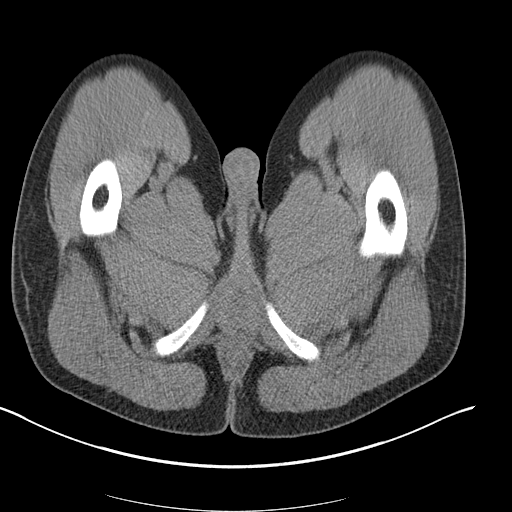
[im 5/90  bone]
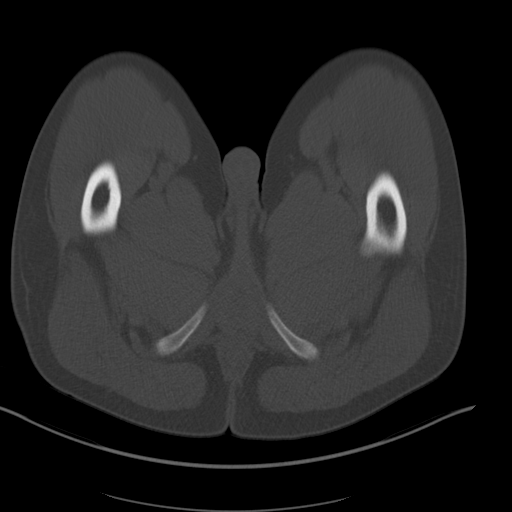
[im 15/90  soft-tissue]
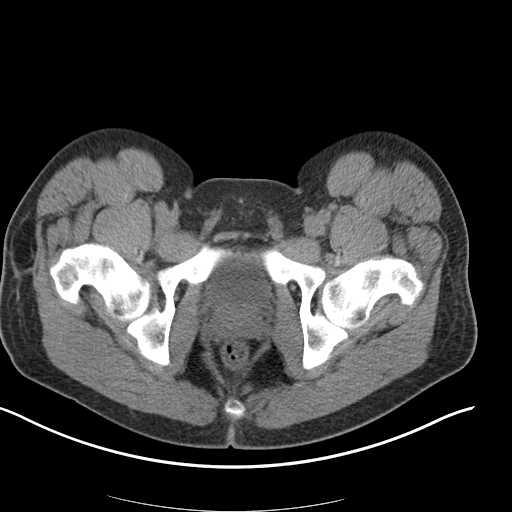
[im 19/90  soft-tissue]
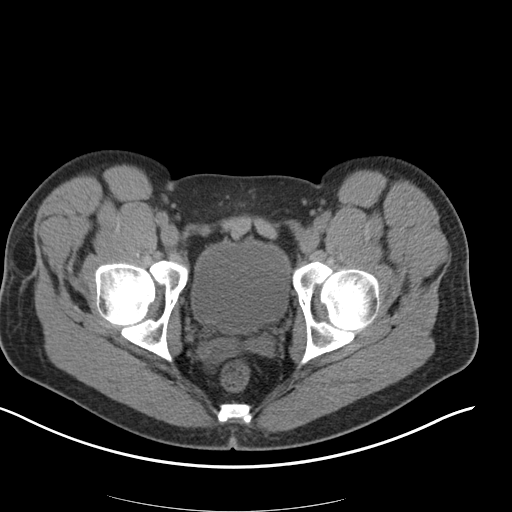
[im 24/90  soft-tissue]
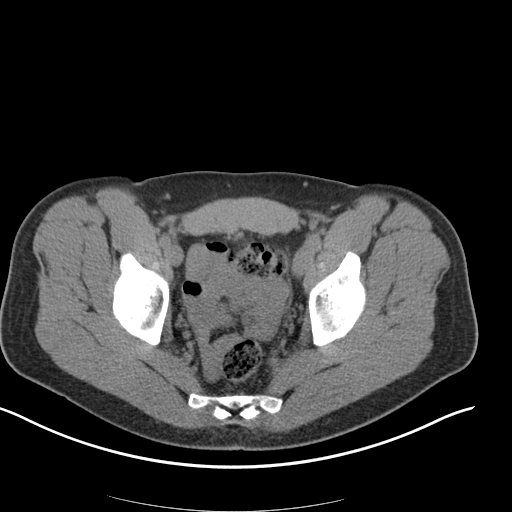
[im 33/90  soft-tissue]
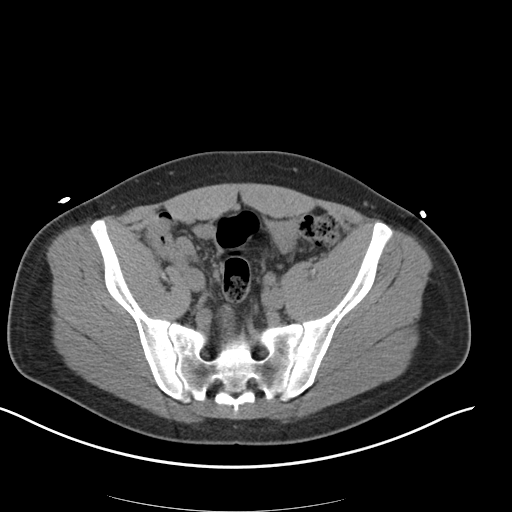
[im 38/90  soft-tissue]
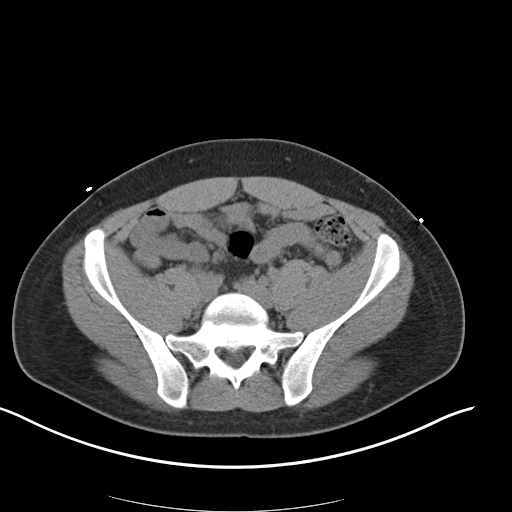
[im 47/90  soft-tissue]
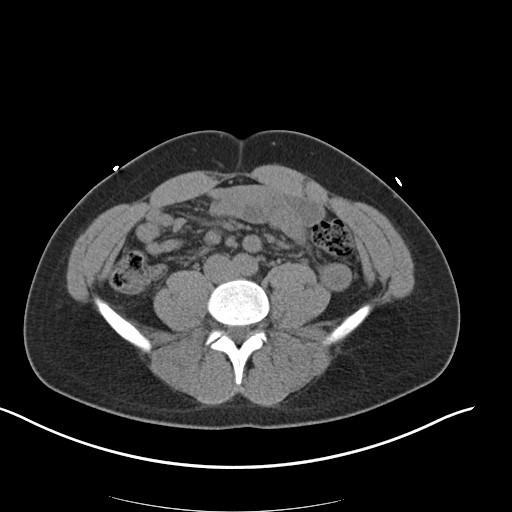
[im 52/90  soft-tissue]
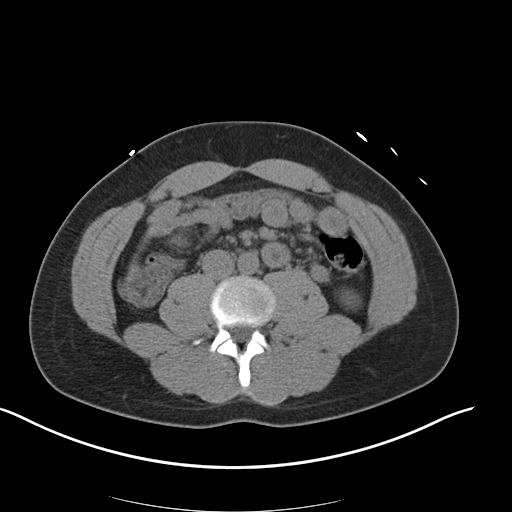
[im 57/90  soft-tissue]
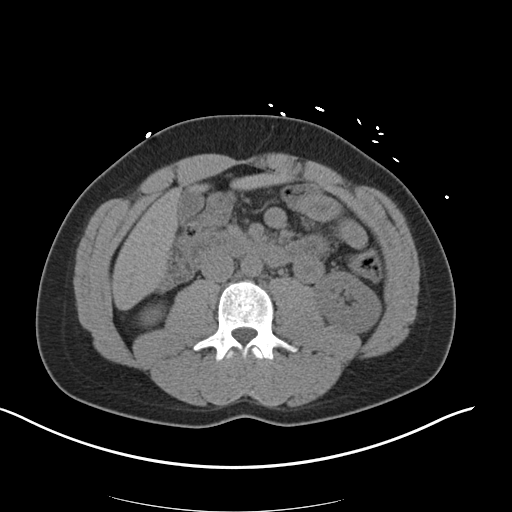
[im 57/90  bone]
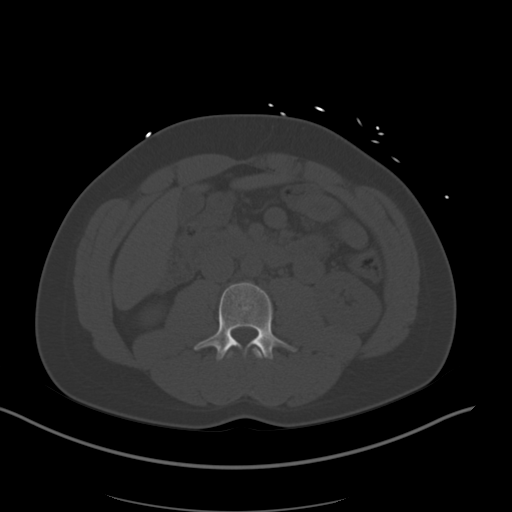
[im 66/90  soft-tissue]
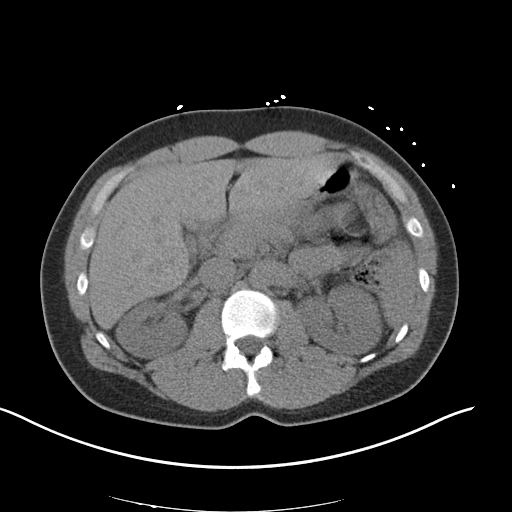
[im 71/90  soft-tissue]
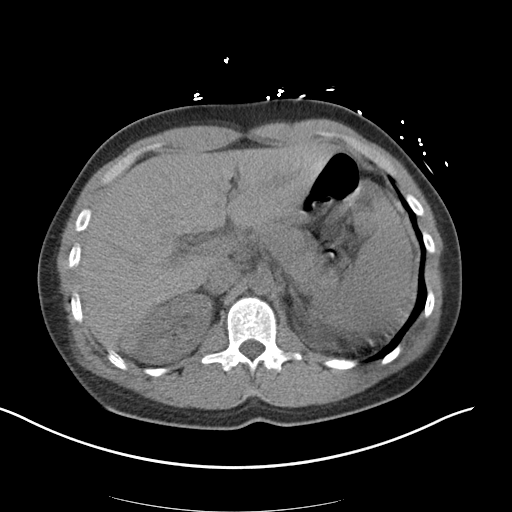
[im 75/90  soft-tissue]
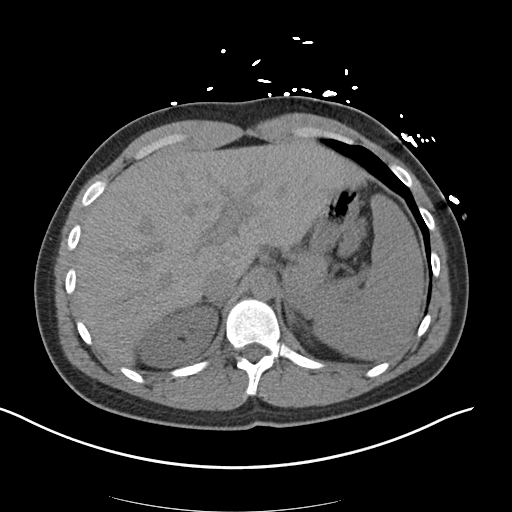
[im 85/90  soft-tissue]
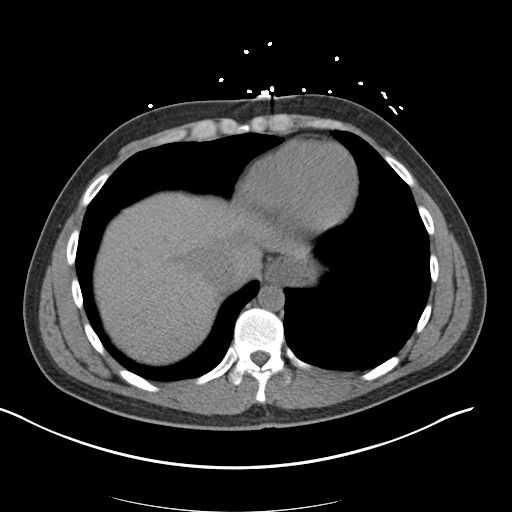

[Series 4: coronal · coronal · 0.78mm/px · 3 of 151 slices shown]
[im 51/151  soft-tissue]
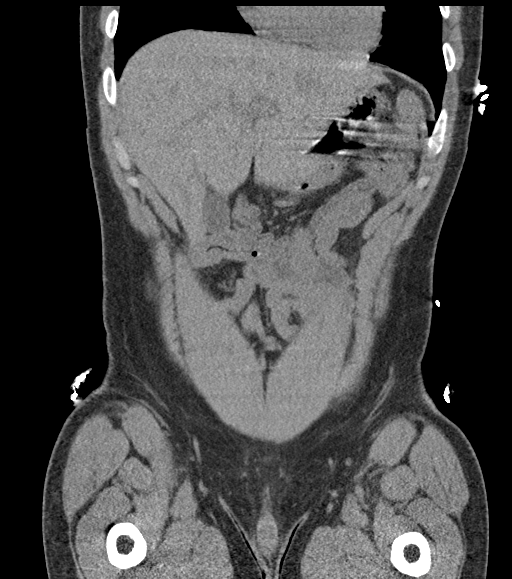
[im 67/151  soft-tissue]
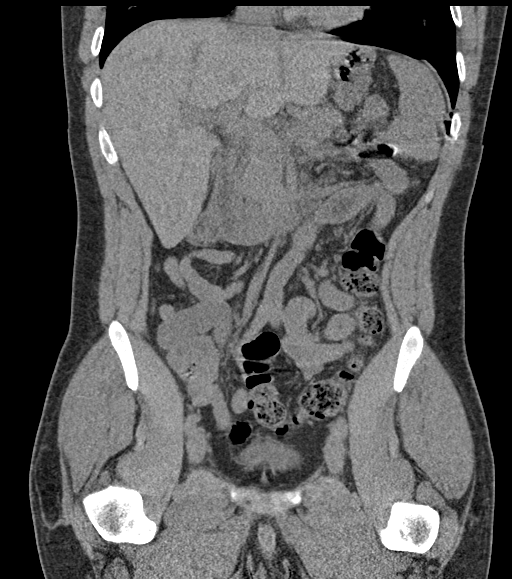
[im 84/151  soft-tissue]
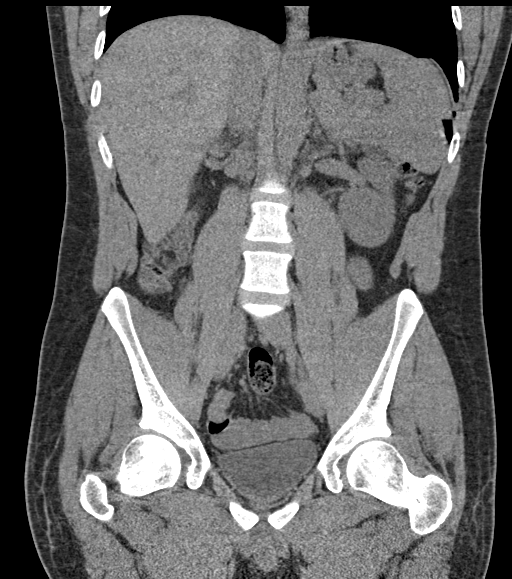

[16 of 46 positions shown; findings below may reference images not displayed]

FINDINGS: Lower chest: Included lung bases are clear.  Heart size is normal.

Hepatobiliary: Unremarkable unenhanced appearance of the liver. No
focal liver lesion identified. Gallbladder within normal limits. No
hyperdense gallstone. No biliary dilatation.

Pancreas: Unremarkable. No pancreatic ductal dilatation or
surrounding inflammatory changes.

Spleen: Normal in size without focal abnormality.

Adrenals/Urinary Tract: Adrenal glands are unremarkable. Kidneys are
normal, without renal calculi, focal lesion, or hydronephrosis.
Bladder is unremarkable.

Stomach/Bowel: Stomach within normal limits. Mild long segment
thickening of the ascending and transverse colon. No dilated loops
of bowel. Appendix not definitively seen. No pericecal inflammatory
changes.

Vascular/Lymphatic: No significant vascular findings are present. No
enlarged abdominal or pelvic lymph nodes.

Reproductive: Prostate is unremarkable.

Other: Trace free fluid within the pelvis. No abdominopelvic fluid
collection. No pneumoperitoneum. No abdominal wall hernia.

Musculoskeletal: No acute or significant osseous findings.
IMPRESSION: 1. Mild long segment thickening of the ascending and transverse
colon, suggestive of an infectious or inflammatory colitis.
2. Trace free fluid within the pelvis, which may be reactive.
3. No evidence of obstructive uropathy.
